# Patient Record
Sex: Female | Born: 1935 | Race: White | Hispanic: No | Smoking: Former smoker
Health system: Southern US, Community
[De-identification: ages and names within clinical notes are randomized; demographics above are authoritative.]

## PROBLEM LIST (undated history)

## (undated) DIAGNOSIS — R413 Other amnesia: Secondary | ICD-10-CM

## (undated) DIAGNOSIS — I10 Essential (primary) hypertension: Secondary | ICD-10-CM

## (undated) DIAGNOSIS — K219 Gastro-esophageal reflux disease without esophagitis: Secondary | ICD-10-CM

## (undated) DIAGNOSIS — R251 Tremor, unspecified: Secondary | ICD-10-CM

## (undated) DIAGNOSIS — F32A Depression, unspecified: Secondary | ICD-10-CM

## (undated) DIAGNOSIS — M542 Cervicalgia: Secondary | ICD-10-CM

## (undated) DIAGNOSIS — F329 Major depressive disorder, single episode, unspecified: Secondary | ICD-10-CM

## (undated) DIAGNOSIS — H3530 Unspecified macular degeneration: Secondary | ICD-10-CM

## (undated) DIAGNOSIS — Z8673 Personal history of transient ischemic attack (TIA), and cerebral infarction without residual deficits: Secondary | ICD-10-CM

## (undated) DIAGNOSIS — F3342 Major depressive disorder, recurrent, in full remission: Secondary | ICD-10-CM

## (undated) DIAGNOSIS — G4730 Sleep apnea, unspecified: Secondary | ICD-10-CM

## (undated) HISTORY — DX: Personal history of transient ischemic attack (TIA), and cerebral infarction without residual deficits: Z86.73

## (undated) HISTORY — PX: CATARACT EXTRACTION, BILATERAL: SHX1313

## (undated) HISTORY — PX: CHOLECYSTECTOMY: SHX55

## (undated) HISTORY — DX: Unspecified macular degeneration: H35.30

## (undated) HISTORY — DX: Cervicalgia: M54.2

## (undated) HISTORY — DX: Tremor, unspecified: R25.1

## (undated) HISTORY — DX: Major depressive disorder, recurrent, in full remission: F33.42

## (undated) HISTORY — DX: Other amnesia: R41.3

## (undated) HISTORY — DX: Major depressive disorder, single episode, unspecified: F32.9

## (undated) HISTORY — DX: Depression, unspecified: F32.A

## (undated) HISTORY — DX: Sleep apnea, unspecified: G47.30

## (undated) HISTORY — DX: Gastro-esophageal reflux disease without esophagitis: K21.9

## (undated) HISTORY — PX: SALIVARY GLAND SURGERY: SHX768

## (undated) HISTORY — DX: Essential (primary) hypertension: I10

---

## 1973-05-02 HISTORY — PX: ABDOMINAL HYSTERECTOMY: SUR658

## 2014-05-02 HISTORY — PX: BUNIONECTOMY: SHX129

## 2016-12-08 DIAGNOSIS — R413 Other amnesia: Secondary | ICD-10-CM | POA: Insufficient documentation

## 2016-12-08 DIAGNOSIS — Z8673 Personal history of transient ischemic attack (TIA), and cerebral infarction without residual deficits: Secondary | ICD-10-CM

## 2016-12-08 DIAGNOSIS — M542 Cervicalgia: Secondary | ICD-10-CM

## 2016-12-08 HISTORY — DX: Personal history of transient ischemic attack (TIA), and cerebral infarction without residual deficits: Z86.73

## 2016-12-08 HISTORY — DX: Other amnesia: R41.3

## 2016-12-08 HISTORY — DX: Cervicalgia: M54.2

## 2017-01-26 DIAGNOSIS — I10 Essential (primary) hypertension: Secondary | ICD-10-CM

## 2017-01-26 DIAGNOSIS — F3342 Major depressive disorder, recurrent, in full remission: Secondary | ICD-10-CM

## 2017-01-26 HISTORY — DX: Major depressive disorder, recurrent, in full remission: F33.42

## 2017-01-26 HISTORY — DX: Essential (primary) hypertension: I10

## 2017-02-06 DIAGNOSIS — R251 Tremor, unspecified: Secondary | ICD-10-CM

## 2017-02-06 HISTORY — DX: Tremor, unspecified: R25.1

## 2017-03-17 ENCOUNTER — Other Ambulatory Visit: Payer: Self-pay | Admitting: Physician Assistant

## 2017-03-17 DIAGNOSIS — R945 Abnormal results of liver function studies: Principal | ICD-10-CM

## 2017-03-17 DIAGNOSIS — R7989 Other specified abnormal findings of blood chemistry: Secondary | ICD-10-CM

## 2017-03-24 ENCOUNTER — Ambulatory Visit
Admission: RE | Admit: 2017-03-24 | Discharge: 2017-03-24 | Disposition: A | Discharge status: Home / Self Care | Payer: Medicare Other | Source: Ambulatory Visit | Attending: Physician Assistant | Admitting: Physician Assistant

## 2017-03-24 DIAGNOSIS — K7689 Other specified diseases of liver: Secondary | ICD-10-CM | POA: Insufficient documentation

## 2017-03-24 DIAGNOSIS — R7989 Other specified abnormal findings of blood chemistry: Secondary | ICD-10-CM

## 2017-03-24 DIAGNOSIS — R945 Abnormal results of liver function studies: Secondary | ICD-10-CM | POA: Diagnosis present

## 2017-03-28 ENCOUNTER — Other Ambulatory Visit: Payer: Self-pay | Admitting: Physician Assistant

## 2017-03-28 DIAGNOSIS — R7989 Other specified abnormal findings of blood chemistry: Secondary | ICD-10-CM

## 2017-03-28 DIAGNOSIS — R945 Abnormal results of liver function studies: Secondary | ICD-10-CM

## 2017-04-04 ENCOUNTER — Ambulatory Visit
Admission: RE | Admit: 2017-04-04 | Discharge: 2017-04-04 | Disposition: A | Discharge status: Home / Self Care | Payer: Medicare Other | Source: Ambulatory Visit | Attending: Physician Assistant | Admitting: Physician Assistant

## 2017-04-04 DIAGNOSIS — R945 Abnormal results of liver function studies: Secondary | ICD-10-CM | POA: Insufficient documentation

## 2017-04-04 DIAGNOSIS — R7989 Other specified abnormal findings of blood chemistry: Secondary | ICD-10-CM

## 2017-04-04 DIAGNOSIS — N2889 Other specified disorders of kidney and ureter: Secondary | ICD-10-CM | POA: Insufficient documentation

## 2017-04-04 DIAGNOSIS — D1803 Hemangioma of intra-abdominal structures: Secondary | ICD-10-CM | POA: Insufficient documentation

## 2017-04-04 MED ORDER — GADOBENATE DIMEGLUMINE 529 MG/ML IV SOLN
20.0000 mL | Freq: Once | INTRAVENOUS | Status: AC | PRN
  Administered 2017-04-04: 19 mL via INTRAVENOUS

## 2017-04-11 ENCOUNTER — Encounter: Payer: Self-pay | Admitting: Urology

## 2017-04-11 ENCOUNTER — Ambulatory Visit (INDEPENDENT_AMBULATORY_CARE_PROVIDER_SITE_OTHER): Payer: Medicare Other | Admitting: Urology

## 2017-04-11 VITALS — BP 144/74 | HR 73 | Ht 62.0 in | Wt 199.0 lb

## 2017-04-11 DIAGNOSIS — N2889 Other specified disorders of kidney and ureter: Secondary | ICD-10-CM

## 2017-04-11 DIAGNOSIS — N3941 Urge incontinence: Secondary | ICD-10-CM | POA: Diagnosis not present

## 2017-04-11 NOTE — Progress Notes (Signed)
04/11/2017 9:14 AM   Pamela Wolfe 30-Jul-1933 [id number]  Referring provider: Marinda Elk, MD [facility] [place] [address]  Chief Complaint  Patient presents with  . Renal Lesion    New patient    HPI: 83 year old female referred for further evaluation of an incidental left upper pole renal mass.  She was recently seen and evaluated for elevated LFTs.  She underwent an MRI of her liver which demonstrated a 2.2 cm benign hemangioma.  On the same scan, a 1.7 cm left upper pole renal lesion was identified with probable contrast enhancement but not characterized.  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   Most recent creatinine 0.8 on 03/16/2017.  No history of gross or microscopic hematuria.  Her family today is concerned about her urinary frequency and incontinence.    She does have a personal history of stroke and developed worsening of her urinary symptoms thereafter.  Initially, she was in urinary retention, once the catheter was removed she had difficulty getting to the bathroom in time with episodes of gross incontinence.    She wears 2-3 pads per day.  Her family reports that she often will not get up for several hours, but when she does she has difficulty getting to the bathroom on time.  They encourage her to use the bathroom more frequently throughout the day.  She only gets up once at night to void.  Overall, she is not particularly bothered by her urinary symptoms other than her incontinence keeps her from being out and about with her family.  She is somewhat of a difficult historian and much of her history today is provided by her daughter and granddaughter who are her caretakers.  She has been evaluated for this in the past and offered what sounds like an InterStim.  She denies ever trying previous medications for overactive bladder.     PMH: Past Medical History:  Diagnosis Date  .  Depression   . Essential hypertension 01/26/2017  . GERD (gastroesophageal reflux disease)   . History of stroke 12/08/2016  . Loss of memory 12/08/2016  . Neck pain 12/08/2016  . Recurrent major depressive disorder, in full remission ([place]) 01/26/2017  . Sleep apnea   . Tremor 02/06/2017    Surgical History: Past Surgical History:  Procedure Laterality Date  . ABDOMINAL HYSTERECTOMY  1975  . BUNIONECTOMY  2016  . CHOLECYSTECTOMY    . SALIVARY GLAND SURGERY      Home Medications:  Allergies as of 04/11/2017   Not on File     Medication List        Accurate as of 04/11/17 11:59 PM. Always use your most recent med list.          amLODipine 5 MG tablet Commonly known as:  NORVASC Take by mouth.   aspirin EC 81 MG tablet Take by mouth.   buPROPion 150 MG 12 hr tablet Commonly known as:  WELLBUTRIN SR   citalopram 20 MG tablet Commonly known as:  CELEXA Take by mouth.   clopidogrel 75 MG tablet Commonly known as:  PLAVIX   losartan-hydrochlorothiazide 100-25 MG tablet Commonly known as:  HYZAAR Take by mouth.   pantoprazole 20 MG tablet Commonly known as:  PROTONIX Take by mouth.       Allergies: Not on File  Family History: Family History  Problem Relation Age of Onset  . Bladder Cancer Neg Hx   .  Kidney cancer Neg Hx   . Prostate cancer Neg Hx     Social History:  reports that she has quit smoking. she has never used smokeless tobacco. She reports that she drinks alcohol. She reports that she does not use drugs.  ROS: 12 point review of systems performed today, negative other than as per HPI.  Of note, patient has word finding difficulties has limited ability to contribute significantly to her history today.    Physical Exam: BP (!) 144/74   Pulse 73   Ht 5\' 2"  (1.575 m)   Wt 199 lb (90.3 kg)   BMI 36.40 kg/m   Constitutional:  Alert and oriented, No acute distress.  Accompanied by daughter and granddaughter today. HEENT: [place] AT, moist mucus  membranes.  Trachea midline, no masses. Cardiovascular: No clubbing, cyanosis, or edema. Respiratory: Normal respiratory effort, no increased work of breathing. GI: Abdomen is soft, nontender, nondistended, no abdominal masses. Obese.   GU: No CVA tenderness.  Skin: No rashes, bruises or suspicious lesions. Neurologic: Grossly intact, no focal deficits, moving all 4 extremities.  Right-sided hand tremor noted.  Word finding difficulties appreciated today, reports that she is accompanied by her niece and cousin rather than daughter and granddaughter. Psychiatric: Normal mood and affect.  Laboratory Data: Labs from 11/18 reviewed Cr 0.8  Urinalysis Urinalysis from 03/16/2017 reviewed, no evidence of infection or microscopic blood  Pertinent Imaging: CLINICAL DATA:  Elevated liver function tests. Hepatic mass on recent ultrasound.  EXAM: MRI ABDOMEN WITHOUT AND WITH CONTRAST  TECHNIQUE: Multiplanar multisequence MR imaging of the abdomen was performed both before and after the administration of intravenous contrast.  CONTRAST:  39mL MULTIHANCE GADOBENATE DIMEGLUMINE 529 MG/ML IV SOLN  COMPARISON:  Ultrasound on 03/24/2017  FINDINGS: Lower chest: No acute findings.  Hepatobiliary: A 2.2 cm benign hemangioma is seen in segment 7 of the right lobe. A few tiny sub-cm T2 hyperintense lesions are also seen in segment 7 which are too small to characterize but most likely represent tiny cysts or hemangiomas. No suspicious liver masses are identified. No evidence of steatosis.  Prior cholecystectomy.  No evidence of biliary ductal dilatation.  Pancreas:  No mass or inflammatory changes.  Spleen:  Within normal limits in size and appearance.  Adrenals/Urinary Tract: A subcapsular lesion is seen in the upper pole of the left kidney which shows T2 hypointensity and probable contrast enhancement. This measures 1.7 cm, and is suspicious for solid renal neoplasm. No other  renal masses identified. No evidence of hydronephrosis.  Stomach/Bowel: Visualized portions within abdomen are unremarkable.  Vascular/Lymphatic: No pathologically enlarged lymph nodes identified. No abdominal aortic aneurysm.  Other:  None.  Musculoskeletal:  No suspicious bone lesions identified.  IMPRESSION: 2.2 cm benign hemangioma in the right hepatic lobe, corresponding with lesion seen on previous ultrasound.  1.7 cm T2 hypointense subcapsular lesion in upper pole of left kidney with probable contrast enhancement. 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 Recommend urology consultation and consideration of biopsy. This recommendation follows ACR consensus guidelines: Management of the Incidental Renal Mass on CT: A White Paper of the ACR Incidental Findings Committee. J Am Coll Radiol [phone].   Electronically Signed   By: Earle Gell M.D.   On: 04/04/2017 11:21  MRI reviewed personally today with the patient and her family.  Postvoid residual today minimal on bladde  Assessment & Plan:    1. Left renal mass A solid renal mass raises the suspicion of primary renal malignancy.  We discussed this in detail and in regards to the spectrum of renal masses which includes cysts (pure cysts are considered benign), solid masses and everything in between. The risk of metastasis increases as the size of solid renal mass increases. In general, it is believed that the risk of metastasis for renal masses less than 3-4 cm is small (up to approximately 5%) based mainly on large retrospective studies. In some cases and especially in patients of older age and multiple comorbidities a surveillance approach may be appropriate. The treatment of solid renal masses includes: surveillance, cryoablation (percutaneous and laparoscopic) in addition to partial and complete nephrectomy (each with option of laparoscopic, robotic and open depending on appropriateness).  Furthermore, nephrectomy appears to be an independent risk factor for the development of chronic kidney disease suggesting that nephron sparing approaches should be implored whenever feasible. We reviewed these options in context of the patients current situation as well as the pros and cons of each. For cystic renal masses, we reviewed the Bosniak classification and discussed that Bosniak 3 lesions harbor a 50% chance of malignancy whereas Bosniak 4 cysts have a solid and 90-95% are malignant in nature.   Given the small size of the lesion, her age and medical comorbidities, I would recommend surveillance.    Plan for repeat CT abdomen with and without contrast in 6 months.  Patient and family are agreeable to this plan.  - CT Abdomen W Contrast; Future  2. Urge incontinence Lengthy discussion today behavioral modification including timed and double voiding during the daytime. She also enjoys sodas and coffee which are bladder irritants, would avoid taking these beverages as much as possible She is also given samples of Myrbetriq 25 mg x 4 weeks today She is not a good candidate for anticholinergic medication given her issues with memory loss and age. Family has agreed to implement behavior changes first, and then use as needed.  The let us know if they would like to fill this prescription.   Return in about 6 months (around 10/10/2017) for CT scan.  [name], MD  [facility] [address], [place], [place] [address] [phone]

## 2017-04-13 ENCOUNTER — Encounter: Payer: Self-pay | Admitting: Urology

## 2017-05-03 ENCOUNTER — Ambulatory Visit: Payer: Self-pay | Admitting: Urology

## 2017-10-05 ENCOUNTER — Ambulatory Visit
Admission: RE | Admit: 2017-10-05 | Discharge: 2017-10-05 | Disposition: A | Discharge status: Home / Self Care | Payer: Medicare Other | Source: Ambulatory Visit | Attending: Urology | Admitting: Urology

## 2017-10-05 DIAGNOSIS — N2889 Other specified disorders of kidney and ureter: Secondary | ICD-10-CM

## 2017-10-05 LAB — POCT I-STAT CREATININE: CREATININE: 0.8 mg/dL (ref 0.44–1.00)

## 2017-10-05 MED ORDER — IOPAMIDOL (ISOVUE-300) INJECTION 61%
100.0000 mL | Freq: Once | INTRAVENOUS | Status: AC | PRN
  Administered 2017-10-05: 100 mL via INTRAVENOUS

## 2017-10-10 ENCOUNTER — Ambulatory Visit (INDEPENDENT_AMBULATORY_CARE_PROVIDER_SITE_OTHER): Payer: Medicare Other | Admitting: Urology

## 2017-10-10 ENCOUNTER — Encounter: Payer: Self-pay | Admitting: Urology

## 2017-10-10 VITALS — BP 138/62 | HR 74 | Ht 63.0 in | Wt 203.0 lb

## 2017-10-10 DIAGNOSIS — N2889 Other specified disorders of kidney and ureter: Secondary | ICD-10-CM | POA: Diagnosis not present

## 2017-10-10 DIAGNOSIS — R351 Nocturia: Secondary | ICD-10-CM | POA: Diagnosis not present

## 2017-10-10 NOTE — Progress Notes (Signed)
10/10/2017 2:57 PM   Pamela Wolfe Dec 02, 1933 [id number]  Referring provider: Marinda Elk, [place] [facility] [place] [address]  Chief Complaint  Patient presents with  . Results    56month    HPI: 84 year old female who returns today for six-month follow-up of an incidental left upper pole renal mass.  Renal mass was incidentally discovered on further evaluation for elevated LFTs.  She underwent an MRI of her liver which demonstrated a 2.2 cm benign hemangioma.  On the same scan, a 1.7 cm left upper pole renal lesion was identified with probable contrast enhancement but not characterized.  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   She follows up today with repeat CT scan abdomen with and without contrast which shows essentially stable size lesion measuring 17 x 16 mm which is exophytic and enhancing.  She also has a personal history of urinary frequency and urge incontinence.  After last visit, she was given Myrbetriq 25 mg for 1 month to try.  She did not feel like she benefited much from this medication.  Her daughter notes that during the daytime, she does pretty well.  Is particularly at nighttime when she has episodes of urinary frequency and incontinence.  She does have a personal history of OSA and has not been using her CPAP.  She also has some mild LE edema.  PMH: Past Medical History:  Diagnosis Date  . Depression   . Essential hypertension 01/26/2017  . GERD (gastroesophageal reflux disease)   . History of stroke 12/08/2016  . Loss of memory 12/08/2016  . Neck pain 12/08/2016  . Recurrent major depressive disorder, in full remission ([place]) 01/26/2017  . Sleep apnea   . Tremor 02/06/2017    Surgical History: Past Surgical History:  Procedure Laterality Date  . ABDOMINAL HYSTERECTOMY  1975  . BUNIONECTOMY  2016  . CHOLECYSTECTOMY    . SALIVARY GLAND SURGERY      Home Medications:    Allergies as of 10/10/2017   No Known Allergies     Medication List        Accurate as of 10/10/17  2:57 PM. Always use your most recent med list.          amLODipine 5 MG tablet Commonly known as:  NORVASC Take by mouth.   aspirin EC 81 MG tablet Take by mouth.   buPROPion 150 MG 12 hr tablet Commonly known as:  WELLBUTRIN SR   citalopram 20 MG tablet Commonly known as:  CELEXA Take by mouth.   clopidogrel 75 MG tablet Commonly known as:  PLAVIX   losartan-hydrochlorothiazide 100-25 MG tablet Commonly known as:  HYZAAR Take by mouth.   pantoprazole 20 MG tablet Commonly known as:  PROTONIX Take by mouth.       Allergies: No Known Allergies  Family History: Family History  Problem Relation Age of Onset  . Bladder Cancer Neg Hx   . Kidney cancer Neg Hx   . Prostate cancer Neg Hx     Social History:  reports that she has quit smoking. She has never used smokeless tobacco. She reports that she drinks alcohol. She reports that she does not use drugs.  ROS: UROLOGY Frequent Urination?: Yes Hard to postpone urination?: No Burning/pain with urination?: No Get up at night to urinate?: Yes Leakage of urine?: Yes Urine stream starts and stops?: No Trouble starting stream?: No Do you have to strain to urinate?:  No Blood in urine?: No Urinary tract infection?: No Sexually transmitted disease?: No Injury to kidneys or bladder?: No Painful intercourse?: No Weak stream?: No Currently pregnant?: No Vaginal bleeding?: No Last menstrual period?: n  Gastrointestinal Nausea?: No Vomiting?: No Indigestion/heartburn?: Yes Diarrhea?: No Constipation?: No  Constitutional Fever: No Night sweats?: No Weight loss?: No Fatigue?: No  Skin Skin rash/lesions?: No Itching?: No  Eyes Blurred vision?: No Double vision?: No  Ears/Nose/Throat Sore throat?: No Sinus problems?: No  Hematologic/Lymphatic Swollen glands?: No Easy bruising?:  No  Cardiovascular Leg swelling?: No Chest pain?: No  Respiratory Cough?: Yes Shortness of breath?: No  Endocrine Excessive thirst?: No  Musculoskeletal Back pain?: No Joint pain?: No  Neurological Headaches?: No Dizziness?: No  Psychologic Depression?: Yes Anxiety?: No  Physical Exam: BP 138/62   Pulse 74   Ht 5\' 3"  (1.6 m)   Wt 203 lb (92.1 kg)   BMI 35.96 kg/m   Constitutional:  Alert and oriented, No acute distress.  Accompanied by daughter today.   HEENT: [place] AT, moist mucus membranes.  Trachea midline, no masses. Cardiovascular: No clubbing, cyanosis, or edema. Respiratory: Normal respiratory effort, no increased work of breathing. Skin: No rashes, bruises or suspicious lesions. Neurologic: Grossly intact, no focal deficits, moving all 4 extremities. Psychiatric: Normal mood and affect.  Laboratory Data: Lab Results  Component Value Date   CREATININE 0.80 10/05/2017    Urinalysis N/a  Pertinent Imaging: CLINICAL DATA:  Renal mass.  EXAM: CT ABDOMEN WITHOUT AND WITH CONTRAST  TECHNIQUE: Multidetector CT imaging of the abdomen was performed following the standard protocol before and following the bolus administration of intravenous contrast.  CONTRAST:  180mL ISOVUE-300 IOPAMIDOL (ISOVUE-300) INJECTION 61%  COMPARISON:  MRI 04/04/2017  FINDINGS: Lower chest:  Lung bases are clear.  Hepatobiliary: Enhancing lesion in the superior aspect of the RIGHT hepatic lobe consistent hemangioma. No biliary duct dilatation. Postcholecystectomy.  Pancreas: Normal pancreatic parenchymal intensity. No ductal dilatation or inflammation.  Spleen: Normal spleen.  Adrenals/urinary tract: Adrenal glands are normal.  Extending from the pole of the LEFT kidney is small round lesion measuring 17 mm by 16 mm (image 62/3). This lesion demonstrates mild post-contrast enhancement and delayed washout (series 16). Lesion is exophytic from the upper pole  in position between the upper pole in the spleen (coronal image 108/11). Lesion contained within the pararenal space.  No additional enhancing renal cortical lesions are present. Several hypodense lesions in LEFT and RIGHT kidney which are too small to characterize likely represent benign cysts.  Stomach/Bowel: Stomach and limited of the small bowel is unremarkable  Vascular/Lymphatic: Abdominal aortic normal caliber. No retroperitoneal periportal lymphadenopathy.  Musculoskeletal: No aggressive osseous lesion  IMPRESSION: 1. Small round enhancing exophytic mass extends from the pole of the LEFT kidney remains concerning for renal cell carcinoma. 2. On small bilateral hypodense lesions in the kidneys too small characterize.   Electronically Signed   By: Suzy Bouchard M.D.   On: 10/05/2017 13:55  CT scan was personally reviewed today.  This was compared to previous imaging from 04/2017 and appears to be unchanged in overall size.  Assessment & Plan:    1. 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 Nocturia We discussed the pathophysiology of primary nighttime symptoms We discussed behavioral modification, compliance with CPAP use and leg elevation during the daytime Not interested in any further pharmacotherapy   Return in about 1 year (around 10/11/2018) for CT scan.  [name], MD  [facility] [address], [place], [place] [address] [phone]

## 2018-10-16 ENCOUNTER — Ambulatory Visit: Payer: Medicare Other | Admitting: Urology

## 2018-10-17 ENCOUNTER — Other Ambulatory Visit: Payer: Self-pay | Admitting: Radiology

## 2018-10-17 DIAGNOSIS — R351 Nocturia: Secondary | ICD-10-CM

## 2018-10-17 DIAGNOSIS — N2889 Other specified disorders of kidney and ureter: Secondary | ICD-10-CM

## 2018-10-18 ENCOUNTER — Telehealth: Payer: Medicare Other | Admitting: Urology

## 2018-10-24 ENCOUNTER — Ambulatory Visit: Admission: RE | Admit: 2018-10-24 | Payer: Medicare Other | Source: Ambulatory Visit

## 2018-10-25 ENCOUNTER — Telehealth: Payer: Medicare Other | Admitting: Urology

## 2018-10-31 ENCOUNTER — Ambulatory Visit
Admission: RE | Admit: 2018-10-31 | Discharge: 2018-10-31 | Disposition: A | Discharge status: Home / Self Care | Payer: Medicare Other | Source: Ambulatory Visit | Attending: Urology | Admitting: Urology

## 2018-10-31 ENCOUNTER — Other Ambulatory Visit: Payer: Self-pay

## 2018-10-31 DIAGNOSIS — R351 Nocturia: Secondary | ICD-10-CM | POA: Diagnosis present

## 2018-10-31 DIAGNOSIS — N2889 Other specified disorders of kidney and ureter: Secondary | ICD-10-CM | POA: Insufficient documentation

## 2018-10-31 LAB — POCT I-STAT CREATININE: Creatinine, Ser: 0.9 mg/dL (ref 0.44–1.00)

## 2018-10-31 MED ORDER — IOHEXOL 300 MG/ML  SOLN
100.0000 mL | Freq: Once | INTRAMUSCULAR | Status: AC | PRN
  Administered 2018-10-31: 100 mL via INTRAVENOUS

## 2018-11-09 ENCOUNTER — Other Ambulatory Visit: Payer: Self-pay

## 2018-11-09 ENCOUNTER — Telehealth (INDEPENDENT_AMBULATORY_CARE_PROVIDER_SITE_OTHER): Payer: Medicare Other | Admitting: Urology

## 2018-11-09 DIAGNOSIS — N2889 Other specified disorders of kidney and ureter: Secondary | ICD-10-CM | POA: Diagnosis not present

## 2018-11-09 NOTE — Progress Notes (Signed)
Virtual Visit via Video Note  I connected with Pamela Wolfe on 11/10/18 at  9:30 AM EDT by a video enabled telemedicine application and verified that I am speaking with the correct person using two identifiers.  Location: Patient: home, accompanied by daughter Provider: office   I discussed the limitations of evaluation and management by telemedicine and the availability of in person appointments. The patient expressed understanding and agreed to proceed.  History of Present Illness: 85 year old female who returns today via virtual visit for surveillance of an incidental renal mass.  Renal mass was incidentally discovered on further evaluation for elevated LFTs. She underwent an MRI of her liver which demonstrated a 2.2 cm benign hemangioma. On the same scan, a 1.7 cm left upper pole renal lesion was identified with probable contrast enhancement but not characterized. 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   CT scan abdomen 09/2017 with and without contrast which shows essentially stable size lesion measuring 17 x 16 mm which is exophytic and enhancing.   She follows up today with serial annual imaging.  This is in the form of CT abdomen with and without contrast on 10/31/2018.  The mass is unchanged in size over the past 12 months.  This CT scan imaging was personally reviewed today and compared to previous.  Overall, she is doing well.  No new medical issues over the past year.  No flank pain or gross hematuria.   Observations/Objective: Accompanied by daughter today.  Appears well.  Assessment and Plan:  1. Left renal mass Stable exophytic left renal mass, unchanged.  Given her age and lack of interval growth, would recommend continued surveillance but no intervention.  She is agreeable this plan.  We will plan for renal ultrasound in 1 year to de-escalate imaging.  She is agreeable this plan.  If her not able to see it on renal  ultrasound, we could get a CT at that point in time.   Follow Up Instructions: F/u 1 year with RUS   I discussed the assessment and treatment plan with the patient. The patient was provided an opportunity to ask questions and all were answered. The patient agreed with the plan and demonstrated an understanding of the instructions.   The patient was advised to call back or seek an in-person evaluation if the symptoms worsen or if the condition fails to improve as anticipated.  I provided 7 minutes of non-face-to-face time during this encounter.   [name], MD

## 2019-05-14 ENCOUNTER — Ambulatory Visit: Payer: Medicare Other | Attending: Internal Medicine

## 2019-05-14 DIAGNOSIS — Z23 Encounter for immunization: Secondary | ICD-10-CM | POA: Insufficient documentation

## 2019-05-14 NOTE — Progress Notes (Signed)
   Covid-19 Vaccination Clinic  Name:  Parnika Segoviano    MRN: [id number] DOB: 1933/07/26  05/14/2019  Ms. Lawton was observed post Covid-19 immunization for 30 minutes based on pre-vaccination screening without incidence. She was provided with Vaccine Information Sheet and instruction to access the V-Safe system.   Ms. Gloria was instructed to call 911 with any severe reactions post vaccine: Marland Kitchen Difficulty breathing  . Swelling of your face and throat  . A fast heartbeat  . A bad rash all over your body  . Dizziness and weakness    Immunizations Administered    Name Date Dose VIS Date Route   Pfizer COVID-19 Vaccine 05/14/2019 11:51 AM 0.3 mL 04/12/2019 Intramuscular   Manufacturer: [place]   Lot: [id number]   [place]: [id number]

## 2019-06-03 ENCOUNTER — Ambulatory Visit: Payer: Medicare Other | Attending: Internal Medicine

## 2019-06-03 DIAGNOSIS — Z23 Encounter for immunization: Secondary | ICD-10-CM | POA: Insufficient documentation

## 2019-06-03 NOTE — Progress Notes (Signed)
   Covid-19 Vaccination Clinic  Name:  Pamela Wolfe    MRN: [id number] DOB: 11/20/1933  06/03/2019  [name] was observed post Covid-19 immunization for 15 minutes without incidence. She was provided with Vaccine Information Sheet and instruction to access the V-Safe system.   [name] was instructed to call 911 with any severe reactions post vaccine: Marland Kitchen Difficulty breathing  . Swelling of your face and throat  . A fast heartbeat  . A bad rash all over your body  . Dizziness and weakness    Immunizations Administered    Name Date Dose VIS Date Route   Pfizer COVID-19 Vaccine 06/03/2019 11:23 AM 0.3 mL 04/12/2019 Intramuscular   Manufacturer: [place]   Lot: [id number]   [place]: [id number]

## 2019-09-16 ENCOUNTER — Telehealth: Payer: Self-pay

## 2019-09-16 NOTE — Telephone Encounter (Signed)
Incoming call from pt's home health nurse ([facility]) Lilia Pro, who is calling in behalf of the pt's family. She states that the pt has had 2-3 urinary infections in the past 2 months. These have been treated by the pt's PCP. The family now questions of pt needs to be on prophylactic antibiotics? Would you like a virtual visit with family to discuss vs. In person visit? Please advise.

## 2019-09-17 NOTE — Telephone Encounter (Signed)
This is a new problem for which she is never been seen or evaluated in our office.  I followed her for a renal mass. Recommend in person visit.  It can be with Larene Beach or Howie Ill, MD

## 2019-09-17 NOTE — Telephone Encounter (Signed)
Called pt's granddaughter per DPR, no answer LM for granddaughter to call back and schedule appt for pt.

## 2019-09-26 ENCOUNTER — Ambulatory Visit: Payer: Medicare Other | Admitting: Physician Assistant

## 2019-09-27 ENCOUNTER — Other Ambulatory Visit: Payer: Self-pay

## 2019-09-27 ENCOUNTER — Encounter: Payer: Self-pay | Admitting: Physician Assistant

## 2019-09-27 ENCOUNTER — Ambulatory Visit (INDEPENDENT_AMBULATORY_CARE_PROVIDER_SITE_OTHER): Payer: Medicare Other | Admitting: Physician Assistant

## 2019-09-27 VITALS — BP 163/67 | HR 77 | Ht 63.0 in | Wt 208.0 lb

## 2019-09-27 DIAGNOSIS — N390 Urinary tract infection, site not specified: Secondary | ICD-10-CM | POA: Diagnosis not present

## 2019-09-27 LAB — URINALYSIS, COMPLETE
Bilirubin, UA: NEGATIVE
Leukocytes,UA: NEGATIVE
Nitrite, UA: NEGATIVE
Protein,UA: NEGATIVE
RBC, UA: NEGATIVE
Specific Gravity, UA: >1.03 — ABNORMAL HIGH (ref 1.005–1.030)
Urobilinogen, Ur: 0.2 mg/dL (ref 0.2–1.0)
pH, UA: 5 (ref 5.0–7.5)

## 2019-09-27 LAB — MICROSCOPIC EXAMINATION: Bacteria, UA: NONE SEEN

## 2019-09-27 LAB — BLADDER SCAN AMB NON-IMAGING

## 2019-09-27 NOTE — Patient Instructions (Signed)
Recurrent UTI Prevention Strategies  1. Stay well hydrated. 2. Continue taking an over-the-counter cranberry supplement for urinary tract health. Take this once or twice daily on an empty stomach, e.g. right before bed. 3. Continue taking an over-the-counter probiotic, preferably containing lactobacillus. Take this daily. 4. Start vaginal estrogen cream. Apply a pea-sized amount around the urethra three times weekly.

## 2019-09-27 NOTE — Progress Notes (Signed)
09/27/2019 12:24 PM   Pamela Wolfe November 13, 1933 [id number]  CC: Chief Complaint  Patient presents with  . Recurrent UTI    HPI: Pamela Wolfe is a 86 y.o. female with PMH stroke, urinary incontinence, and dementia who presents today for evaluation of recurrent UTI. She is an established [facility] patient who last saw Dr. Erlene Quan on 11/09/2018 for surveillance visit of a left upper pole renal lesion.  She is accompanied today by her daughter, who contributes to HPI.  Patient's daughter reports that her mother has a long history of UTIs, frequency unknown.  She leaves that the frequency of UTIs suddenly increased after her mother's October 2016 stroke, which rendered her incontinent; she wears a diaper for management of this. Patient was previously seen by a urologist in [place] who offered an implantable device for management of her urinary incontinence around 2017.  She declined this.  Now, patient experiences an average of 3-4 UTIs annually, however she has had 3 UTIs in the past 2 months.  Per chart review, patient has had 2 positive urine cultures in the past 6 months, both with pansensitive E. coli.  Patient's daughter reports that her mother has poor insight into her UTIs.  Daughter states she can identify when her mother has a UTI based on her mother wincing in pain with urination.  She denies gross hematuria, patient denies flank pain.  Patient has had a remote history of constipation, resolved over the past 2 years.  She does have urinary frequency at baseline.  Additionally, she is known to have poor compliance with medications, fluid intake, and hygiene.  Patient has been taking cranberry supplements once daily after breakfast for the past 2 years.  Additionally, she started a daily probiotic for UTIs containing lactobacillus 1 week ago.  She does not have a history of nephrolithiasis or breast cancer.  CT abdomen with and without contrast dated 10/31/2019 without nephrolithiasis.  Patient's  daughter is not concerned for UTI today.  In-office UA today positive for trace glucose and trace ketones; urine microscopy pan negative. PVR 29mL.  PMH: Past Medical History:  Diagnosis Date  . Depression   . Essential hypertension 01/26/2017  . GERD (gastroesophageal reflux disease)   . History of stroke 12/08/2016  . Loss of memory 12/08/2016  . Neck pain 12/08/2016  . Recurrent major depressive disorder, in full remission ([place]) 01/26/2017  . Sleep apnea   . Tremor 02/06/2017    Surgical History: Past Surgical History:  Procedure Laterality Date  . ABDOMINAL HYSTERECTOMY  1975  . BUNIONECTOMY  2016  . CHOLECYSTECTOMY    . SALIVARY GLAND SURGERY      Home Medications:  Allergies as of 09/27/2019   No Known Allergies     Medication List       Accurate as of Sep 27, 2019 12:24 PM. If you have any questions, ask your nurse or doctor.        amLODipine 5 MG tablet Commonly known as: NORVASC Take by mouth.   aspirin EC 81 MG tablet Take by mouth.   buPROPion 150 MG 12 hr tablet Commonly known as: WELLBUTRIN SR   citalopram 20 MG tablet Commonly known as: CELEXA Take by mouth.   clopidogrel 75 MG tablet Commonly known as: PLAVIX   losartan-hydrochlorothiazide 100-25 MG tablet Commonly known as: HYZAAR Take by mouth.   meloxicam 7.5 MG tablet Commonly known as: MOBIC Take 7.5 mg by mouth daily.   memantine 10 MG tablet Commonly known as: NAMENDA Take  by mouth.   pantoprazole 20 MG tablet Commonly known as: PROTONIX Take by mouth.       Allergies:  No Known Allergies  Family History: Family History  Problem Relation Age of Onset  . Bladder Cancer Neg Hx   . Kidney cancer Neg Hx   . Prostate cancer Neg Hx     Social History:   reports that she has quit smoking. She has never used smokeless tobacco. She reports current alcohol use. She reports that she does not use drugs.  Physical Exam: BP (!) 163/67   Pulse 77   Ht 5\' 3"  (1.6 m)   Wt 208 lb  (94.3 kg)   BMI 36.85 kg/m   Constitutional:  Alert, no acute distress, nontoxic appearing HEENT: [place], AT Cardiovascular: No clubbing, cyanosis, or edema Respiratory: Normal respiratory effort, no increased work of breathing Skin: No rashes, bruises or suspicious lesions Neurologic: Grossly intact, no focal deficits, moving all 4 extremities Psychiatric: Not responding appropriately to questions  Laboratory Data: Results for orders placed or performed in visit on 09/27/19  Microscopic Examination   URINE  Result Value Ref Range   WBC, UA 0-5 0 - 5 /hpf   RBC 0-2 0 - 2 /hpf   Epithelial Cells (non renal) 0-10 0 - 10 /hpf   Casts Present (A) None seen /lpf   Cast Type Hyaline casts N/A   Mucus, UA Present (A) Not Estab.   Bacteria, UA None seen None seen/Few  Urinalysis, Complete  Result Value Ref Range   Specific Gravity, UA >1.030 (H) 1.005 - 1.030   pH, UA 5.0 5.0 - 7.5   Color, UA Yellow Yellow   Appearance Ur Cloudy (A) Clear   Leukocytes,UA Negative Negative   Protein,UA Negative Negative/Trace   Glucose, UA Trace (A) Negative   Ketones, UA Trace (A) Negative   RBC, UA Negative Negative   Bilirubin, UA Negative Negative   Urobilinogen, Ur 0.2 0.2 - 1.0 mg/dL   Nitrite, UA Negative Negative   Microscopic Examination See below:   Bladder Scan (Post Void Residual) in office  Result Value Ref Range   Scan Result 26mL    Pertinent Imaging: CT abdomen with and without contrast, 10/31/2018: CLINICAL DATA:  Left renal mass follow up  EXAM: CT ABDOMEN WITHOUT AND WITH CONTRAST  TECHNIQUE: Multidetector CT imaging of the abdomen was performed following the standard protocol before and following the bolus administration of intravenous contrast.  CONTRAST:  116mL OMNIPAQUE IOHEXOL 300 MG/ML  SOLN  COMPARISON:  Serve multiple 10/05/2017 and MRI from 04/04/2017  FINDINGS: Lower chest: Calcified granuloma in the posterior basal segment right lower lobe. Calcified  right infrahilar lymph nodes. Coronary artery and aortic valve calcifications.  Hepatobiliary: Delayed enhancement in the patient's known 2.5 by 2.1 cm right hepatic lobe hemangioma, image 43/15. Focal accentuated enhancement measuring 0.7 cm in diameter the subcapsular portion of the right hepatic lobe on image 60/8, likewise most compatible with hemangioma based on delayed enhancement on 04/04/2017 MRI. Cholecystectomy.  Pancreas: Unremarkable  Spleen: Unremarkable  Adrenals/Urinary Tract: Adrenal glands appear normal.  1.8 by 1.6 cm left kidney upper pole exophytic lesion with precontrast density 46 Hounsfield units, postcontrast density 90 Hounsfield units on arterial phase images, and delayed phase image density averaging about 77 Hounsfield units. Appearance compatible with enhancing mass/renal cell carcinoma. Comparing to the earliest available imaging of 04/04/2017, I see no appreciable change in size.  Other tiny hypodense lesions in both kidneys are technically too small to  characterize although statistically most likely to be cysts. No appreciable upper urinary tract calculi or other filling defects along the upper urinary tract urothelium.  Stomach/Bowel: Small hiatal hernia. Descending colon diverticulosis.  Vascular/Lymphatic: Aortoiliac atherosclerotic vascular disease. No tumor thrombus in the left renal vein. No pathologic adenopathy.  Other: No supplemental non-categorized findings.  Musculoskeletal: Multilevel lumbar spondylosis and degenerative disc disease.  IMPRESSION: 1. 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 This is not appreciably changed in size compared to the earliest available comparison of 04/04/2017. No adenopathy or tumor thrombus in the left renal vein. 2. Other imaging findings of potential clinical significance: 2 right hepatic lobe hemangiomas are  present. Small hiatal hernia. Descending colon diverticulosis. Aortic Atherosclerosis (ICD10-I70.0). Lumbar spondylosis and degenerative disc disease.   Electronically Signed   By: Van Clines M.D.   On: 10/31/2018 13:14  I personally reviewed the images referenced above and note the absence of bilateral nephrolithiasis.  Assessment & Plan:   1. Recurrent UTI 86 year old female with PMH dementia, stroke, urinary incontinence, and left upper pole renal mass on active surveillance presents today for evaluation of recurrent UTI.  It does not appear that this is a new problem, however frequency may be increasing.  She has had 2 positive urine cultures in the past 6 months and meets the clinical definition for recurrent UTI.  Case complicated by patient's poor compliance with medications, fluid intake, and hygiene.  Additionally, her dementia limits her insight into her voiding symptoms so is difficult to determine if her wincing as noted by her daughter represents true dysuria versus vaginal burning in the setting of atrophic vaginitis.  Additionally, she is at risk of asymptomatic bacteriuria due to her age.  UA today reassuring for infection.  Will send for culture.  If urine culture is positive, this will increase my suspicion for asymptomatic bacteriuria as contributory to this picture.  I had a lengthy conversation with the patient and her daughter today.  I explained that I would like to start with conservative management and defer suppressive antibiotics at this time.  They are in agreement with this plan.  I counseled them to continue cranberry supplementation, ideally twice daily on an empty stomach, continue daily lactobacillus-containing probiotic, increase fluid intake, and start topical vaginal estrogen cream 3 times weekly.  I provided Premarin samples in clinic today.  We will plan for symptom recheck at her next scheduled follow-up in approximately 2 months.  If these  measures do not decrease her frequency of infection, may consider suppressive antibiotics in the future.  Counseled patient to return to clinic sooner if she develops dysuria in the interim.  Patient and daughter expressed understanding. - Urinalysis, Complete - CULTURE, URINE COMPREHENSIVE - Bladder Scan (Post Void Residual) in office  Return in about 2 months (around 11/27/2019) for rUTI, renal mass follow-up with Dr. Erlene Quan.  Debroah Loop, PA-C  [facility] [address] [place], [place] [address] [phone]

## 2019-10-01 LAB — CULTURE, URINE COMPREHENSIVE

## 2019-11-07 ENCOUNTER — Telehealth: Payer: Self-pay | Admitting: *Deleted

## 2019-11-07 NOTE — Telephone Encounter (Signed)
Left VM to have Renal US done prior to appointment.

## 2019-11-12 ENCOUNTER — Ambulatory Visit: Payer: Medicare Other | Admitting: Urology

## 2019-11-22 ENCOUNTER — Other Ambulatory Visit: Payer: Self-pay | Admitting: *Deleted

## 2019-11-22 DIAGNOSIS — N2889 Other specified disorders of kidney and ureter: Secondary | ICD-10-CM

## 2019-11-27 ENCOUNTER — Ambulatory Visit: Payer: Medicare Other | Admitting: Urology

## 2019-12-03 ENCOUNTER — Other Ambulatory Visit: Payer: Self-pay

## 2019-12-03 ENCOUNTER — Ambulatory Visit
Admission: RE | Admit: 2019-12-03 | Discharge: 2019-12-03 | Disposition: A | Discharge status: Home / Self Care | Payer: Medicare Other | Source: Ambulatory Visit | Attending: Urology | Admitting: Urology

## 2019-12-03 DIAGNOSIS — N2889 Other specified disorders of kidney and ureter: Secondary | ICD-10-CM | POA: Insufficient documentation

## 2019-12-03 NOTE — Progress Notes (Signed)
12/04/2019 1:47 PM   [name] 1933/12/27 [id number]  Referring provider: Marinda Elk, MD [place] [facility],  [place] [address] Chief Complaint  Patient presents with  . left renal mass    HPI: Pamela Wolfe is a 86 y.o. female with urinary incontinence and recurrent UTI returns for follow up and review of RUS.   Patient's daughter reports that her mother has a long history of UTIs, frequency unknown.  She leaves that the frequency of UTIs suddenly increased after her mother's October 2016 stroke, which rendered her incontinent; she wears a diaper for management of this. Patient was previously seen by a urologist in [place] who offered an implantable device for management of her urinary incontinence around 2017.  She declined this.  Now, patient experiences an average of 3-4 UTIs annually, however she has had 3 UTIs in the past 2 months.  Per chart review, patient has had 2 positive urine cultures in the past 6 months, both with pansensitive E. Coli  Patient has had a remote history of constipation, resolved over the past 2 years.  She does have urinary frequency at baseline. Additionally, she is known to have poor compliance with medications, fluid intake, and hygiene.  RUS on 12/03/2019 showed left upper pole renal lesion measures 1.9 x 2 x 2 cm. This previously measured 1.8 x 1.6 cm on 10/31/2018 CT. The apparent increased size may be due to true growth versus differences in modality. Otherwise unremarkable renal ultrasound.  The patient has been having a lot of accidents recently secondary to worsening incontinence s/p stroke. She stopped using estrogen cream. Her daughter notes having trouble keep pads in stock for use.    PMH: Past Medical History:  Diagnosis Date  . Depression   . Essential hypertension 01/26/2017  . GERD (gastroesophageal reflux disease)   . History of stroke 12/08/2016  . Loss of memory 12/08/2016  . Macular  degeneration   . Neck pain 12/08/2016  . Recurrent major depressive disorder, in full remission ([place]) 01/26/2017  . Sleep apnea   . Tremor 02/06/2017    Surgical History: Past Surgical History:  Procedure Laterality Date  . ABDOMINAL HYSTERECTOMY  1975  . BUNIONECTOMY  2016  . CATARACT EXTRACTION, BILATERAL    . CHOLECYSTECTOMY    . SALIVARY GLAND SURGERY      Home Medications:  Allergies as of 12/04/2019   No Known Allergies     Medication List       Accurate as of December 04, 2019  1:47 PM. If you have any questions, ask your nurse or doctor.        amLODipine 5 MG tablet Commonly known as: NORVASC Take by mouth.   aspirin EC 81 MG tablet Take by mouth.   Besivance 0.6 % Susp Generic drug: Besifloxacin HCl Place 1 drop into the left eye 3 (three) times daily.   buPROPion 150 MG 12 hr tablet Commonly known as: WELLBUTRIN SR   citalopram 20 MG tablet Commonly known as: CELEXA Take by mouth.   clopidogrel 75 MG tablet Commonly known as: PLAVIX   Durezol 0.05 % Emul Generic drug: Difluprednate   losartan-hydrochlorothiazide 100-25 MG tablet Commonly known as: HYZAAR Take by mouth.   meloxicam 7.5 MG tablet Commonly known as: MOBIC Take 7.5 mg by mouth daily.   memantine 10 MG tablet Commonly known as: NAMENDA Take by mouth.   pantoprazole 20 MG tablet Commonly known as: PROTONIX Take by mouth.   Prolensa  0.07 % Soln Generic drug: Bromfenac Sodium SMARTSIG:1 Drop(s) Right Eye Every Evening       Allergies: No Known Allergies  Family History: Family History  Problem Relation Age of Onset  . Bladder Cancer Neg Hx   . Kidney cancer Neg Hx   . Prostate cancer Neg Hx     Social History:  reports that she has quit smoking. She has never used smokeless tobacco. She reports current alcohol use. She reports that she does not use drugs.   Physical Exam: BP (!) 143/85   Pulse 72   Wt 208 lb (94.3 kg)   BMI 36.85 kg/m   Constitutional:  Alert  and oriented, No acute distress. Accompanied by daughter. HEENT: [place] AT, moist mucus membranes.  Trachea midline, no masses. Cardiovascular: No clubbing, cyanosis, or edema. Respiratory: Normal respiratory effort, no increased work of breathing. Skin: No rashes, bruises or suspicious lesions. Neurologic: Grossly intact, no focal deficits, moving all 4 extremities. Psychiatric: Normal mood and affect.    Pertinent Imaging:  Results for orders placed during the hospital encounter of 12/03/19  Ultrasound renal complete  Narrative CLINICAL DATA:  Known left renal mass.  EXAM: RENAL / URINARY TRACT ULTRASOUND COMPLETE  COMPARISON:  CT 10/31/2018.  MRI 04/04/2017  FINDINGS: Right Kidney:  Renal measurements: 10.3 x 5.1 x 5.4 cm = volume: 148 mL. Echogenicity within normal limits. No mass or hydronephrosis visualized.  Left Kidney:  Renal measurements: 9.7 x 4.9 x 5.5 cm = volume: 136 mL. Hypoechoic lesion in the upper left kidney measures 1.9 x 2 x 2 cm. No other focal renal lesion. No hydronephrosis. Normal renal echogenicity.  Bladder:  Appears normal for degree of bladder distention.  Other:  None.  IMPRESSION: 1. Left upper pole renal lesion measures 1.9 x 2 x 2 cm. This previously measured 1.8 x 1.6 cm on 10/31/2018 CT. The apparent increased size may be due to true growth versus differences in modality. 2. Otherwise unremarkable renal ultrasound.   Electronically Signed By: Keith Rake M.D. On: 12/03/2019 14:28   I have personally reviewed the images and agree with radiologist interpretation.     Assessment & Plan:    1. Left renal mass Probable malignancy showing a ~3 mm growth on surveillance RUS.   Difference in size may be also partially accounted for by change in imaging modality from CT to renal ultrasound.  Again, given age and comorbidities as well as relatively slow growth rate and overall size of the lesion less than 3 cm, she remains  a good candidate for active surveillance.  Both she and her daughter are agreeable this plan.  Repeat RUS in 1 year for comparison using similar modalities.  2. rUTI  No infections since 08/2019, will treat symptoms as needed.  Continue prevention techniques including probiotics and cranberry tablets.   Advised to return to our office specifically if she has any UTI type symptoms  3. Urinary incontinence Discussed options for managing urinary incontinence which primarily would include behavioral modification, ensuring daily bowel movement as well as consideration of pharmacotherapy.  Given her confusion at times, she be best served with a beta 3 agonist.  At this point time, they do not want to pursue pharmacotherapy.  Follow up in 1 year with [facility] [address] [place], [place] [address] [phone]  I, Selena Batten, am acting as a scribe for Dr. [name].  I have reviewed the above documentation for accuracy and  completeness, and I agree with the above.   [name], MD

## 2019-12-04 ENCOUNTER — Ambulatory Visit (INDEPENDENT_AMBULATORY_CARE_PROVIDER_SITE_OTHER): Payer: Medicare Other | Admitting: Urology

## 2019-12-04 ENCOUNTER — Encounter: Payer: Self-pay | Admitting: Urology

## 2019-12-04 VITALS — BP 143/85 | HR 72 | Wt 208.0 lb

## 2019-12-04 DIAGNOSIS — N2889 Other specified disorders of kidney and ureter: Secondary | ICD-10-CM

## 2019-12-26 ENCOUNTER — Telehealth: Payer: Self-pay | Admitting: Urology

## 2019-12-26 NOTE — Telephone Encounter (Signed)
Spoke with patients daughter explained that we needed to see the patient per Judson Roch our lead CMA and she was not happy about having to bring her mother into the clinic. Stated that our policy was stupid and that we should just be able to look into her chart at the past labs and giver her the same ABX for her symptoms. I explained that we are unable to do that as we needed to look at her urine to make sure we are treating her with the correct ABX for the correct infection if she indeed had an infection. She did agree to an appointment for 12-27-19 with Sam. She was upset still about having to have an app and we could not just call something in. I did advise her to go to the urgent care or call her PCP if she felt that she was in need of immediate care today. She said she would not take her to a place like that ever.   Sharyn Lull

## 2019-12-26 NOTE — Telephone Encounter (Signed)
Pt.'s daughter called to report mother is having Dysuria and would like a prescription called in because she does not want to bring pt. To office unless absolutley necessary. Pt.'s daughter would like someone to call her at [phone].

## 2019-12-27 ENCOUNTER — Ambulatory Visit: Payer: Medicare Other | Admitting: Physician Assistant

## 2020-12-03 ENCOUNTER — Ambulatory Visit
Admission: RE | Admit: 2020-12-03 | Discharge: 2020-12-03 | Disposition: A | Discharge status: Home / Self Care | Payer: Medicare Other | Source: Ambulatory Visit | Attending: Urology | Admitting: Urology

## 2020-12-03 ENCOUNTER — Other Ambulatory Visit: Payer: Self-pay

## 2020-12-03 DIAGNOSIS — N2889 Other specified disorders of kidney and ureter: Secondary | ICD-10-CM | POA: Diagnosis not present

## 2020-12-08 NOTE — Progress Notes (Signed)
12/15/20 12:56 PM   Pamela Wolfe 02-Feb-1934 [id number]  Referring provider:  Marinda Elk, MD [facility] [place],  [place] [address] Chief Complaint  Patient presents with   Left renal mass     HPI: Pamela Wolfe is a 87 y.o.female with urinary incontinence and recurrent UTI who returns for 1 year follow-up and RUS.   She was previously seen by a urologist in [place] who offered an implantable device for management of her urinary incontinence around 2017.  She declined this.  RUS on 12/03/2020 which revealed cystic lesion within the left kidney which corresponds to that seen on prior CT and ultrasound. It appeared simple in nature on exam measuring 1.6 cm.  She is accompanies by her daughter today   She started having dysuria a couple days ago. Has had increased urinary symptoms since Thursday. Daughter states she hs low output but not been taking much water recently.   PMH: Past Medical History:  Diagnosis Date   Depression    Essential hypertension 01/26/2017   GERD (gastroesophageal reflux disease)    History of stroke 12/08/2016   Loss of memory 12/08/2016   Macular degeneration    Neck pain 12/08/2016   Recurrent major depressive disorder, in full remission ([place]) 01/26/2017   Sleep apnea    Tremor 02/06/2017    Surgical History: Past Surgical History:  Procedure Laterality Date   ABDOMINAL HYSTERECTOMY  1975   BUNIONECTOMY  2016   CATARACT EXTRACTION, BILATERAL     CHOLECYSTECTOMY     SALIVARY GLAND SURGERY      Home Medications:  Allergies as of 12/09/2020   No Known Allergies      Medication List        Accurate as of December 09, 2020 11:59 PM. If you have any questions, ask your nurse or doctor.          STOP taking these medications    Besivance 0.6 % Susp Generic drug: Besifloxacin HCl Stopped by: [name], MD   Durezol 0.05 % Emul Generic drug: Difluprednate Stopped by: [name], MD    Prolensa 0.07 % Soln Generic drug: Bromfenac Sodium Stopped by: [name], MD       TAKE these medications    amLODipine 5 MG tablet Commonly known as: NORVASC Take 1 tablet by mouth daily.   aspirin EC 81 MG tablet Take by mouth.   buPROPion 150 MG 12 hr tablet Commonly known as: WELLBUTRIN SR Take 1 tablet by mouth 2 (two) times daily.   citalopram 20 MG tablet Commonly known as: CELEXA Take by mouth.   clopidogrel 75 MG tablet Commonly known as: PLAVIX Take 1 tablet by mouth daily.   HYDROcodone bit-homatropine 5-1.5 MG/5ML syrup Commonly known as: HYCODAN Hydromet 5 mg-1.5 mg/5 mL oral syrup   losartan-hydrochlorothiazide 100-25 MG tablet Commonly known as: HYZAAR Take by mouth.   memantine 10 MG tablet Commonly known as: NAMENDA Take 1 tablet by mouth 2 (two) times daily.   pantoprazole 20 MG tablet Commonly known as: PROTONIX Take by mouth.        Allergies: No Known Allergies  Family History: Family History  Problem Relation Age of Onset   Bladder Cancer Neg Hx    Kidney cancer Neg Hx    Prostate cancer Neg Hx     Social History:  reports that she has quit smoking. She has never used smokeless tobacco. She reports current alcohol use. She reports that she does not  use drugs.   Physical Exam: BP (!) 156/83   Pulse 69   Ht '5\' 3"'$  (1.6 m)   BMI 36.85 kg/m   Constitutional:  Alert and oriented, No acute distress. HEENT: [place] AT, moist mucus membranes.  Trachea midline, no masses. Cardiovascular: No clubbing, cyanosis, or edema. Respiratory: Normal respiratory effort, no increased work of breathing. Skin: No rashes, bruises or suspicious lesions. Neurologic: Grossly intact, no focal deficits, moving all 4 extremities. Psychiatric: Normal mood and affect.  Laboratory Data:  Lab Results  Component Value Date   CREATININE 0.90 10/31/2018     Urinalysis Results for orders placed or performed in visit on 12/09/20  Urine culture    Specimen: Urine   Urine  Result Value Ref Range   Urine Culture, Routine Final report (A)    Organism ID, Bacteria Escherichia coli (A)    Antimicrobial Susceptibility Comment   Microscopic Examination   Urine  Result Value Ref Range   WBC, UA 6-10 (A) 0 - 5 /hpf   RBC None seen 0 - 2 /hpf   Epithelial Cells (non renal) 0-10 0 - 10 /hpf   Bacteria, UA Moderate (A) None seen/Few  Urinalysis, Complete  Result Value Ref Range   Specific Gravity, UA 1.015 1.005 - 1.030   pH, UA 6.5 5.0 - 7.5   Color, UA Yellow Yellow   Appearance Ur Clear Clear   Leukocytes,UA Negative Negative   Protein,UA Negative Negative/Trace   Glucose, UA Negative Negative   Ketones, UA Negative Negative   RBC, UA Negative Negative   Bilirubin, UA Negative Negative   Urobilinogen, Ur 0.2 0.2 - 1.0 mg/dL   Nitrite, UA Positive (A) Negative   Microscopic Examination See below:      Pertinent Imaging: CLINICAL DATA:  Follow-up left renal lesion   EXAM: RENAL / URINARY TRACT ULTRASOUND COMPLETE   COMPARISON:  12/03/2019   FINDINGS: Right Kidney:   Renal measurements: 10.1 x 4.9 x 4.4 cm. = volume: 114 mL. Echogenicity within normal limits. No mass or hydronephrosis visualized.   Left Kidney:   Renal measurements: 9.9 x 5.0 x 4.8 cm. = volume: 125 mL. 1.6 cm hypoechoic lesion is noted within the left kidney similar to that seen on prior ultrasound CT examination. It is better visualized on the current exam as a simply cystic lesion.   Bladder:   Appears normal for degree of bladder distention.   Other:   None.   IMPRESSION: Cystic lesion within the left kidney which corresponds to that seen on prior CT and ultrasound. It appears simple in nature on today's exam measuring 1.6 cm.     Electronically Signed   By: Inez Catalina M.D.   On: 12/05/2020 01:15   I have personally reviewed the images and agree with radiologist interpretation.   Urinalysis: 6-10 WBC, and trace nitrite.     Assessment & Plan:    Left renal lesion - Unchanged in size and appear more simple today which is reassuring, previously was concerning for possible - will continue to with annual RUS due to its history of being more complex    Recurrent UTI / dysuria - Urine today mildly suspicious but symptoms are relatively mild -We will culture and treat as needed -Advised to call if her symptoms worsen in the interim to go ahead and start treatment earlier   Follow up in 1 year with RUS   Conley Rolls as a scribe for [name], MD.,have documented all relevant documentation on the  behalf of [name], MD,as directed by  [name], MD while in the presence of [name], MD.  I have reviewed the above documentation for accuracy and completeness, and I agree with the above.   [name], MD  [facility] [address] [place], [place] [address] [phone]

## 2020-12-09 ENCOUNTER — Ambulatory Visit (INDEPENDENT_AMBULATORY_CARE_PROVIDER_SITE_OTHER): Payer: Medicare Other | Admitting: Urology

## 2020-12-09 ENCOUNTER — Encounter: Payer: Self-pay | Admitting: Urology

## 2020-12-09 ENCOUNTER — Other Ambulatory Visit: Payer: Self-pay

## 2020-12-09 VITALS — BP 156/83 | HR 69 | Ht 63.0 in

## 2020-12-09 DIAGNOSIS — N2889 Other specified disorders of kidney and ureter: Secondary | ICD-10-CM | POA: Diagnosis not present

## 2020-12-09 DIAGNOSIS — R300 Dysuria: Secondary | ICD-10-CM | POA: Diagnosis not present

## 2020-12-10 LAB — URINALYSIS, COMPLETE
Bilirubin, UA: NEGATIVE
Glucose, UA: NEGATIVE
Ketones, UA: NEGATIVE
Leukocytes,UA: NEGATIVE
Nitrite, UA: POSITIVE — AB
Protein,UA: NEGATIVE
RBC, UA: NEGATIVE
Specific Gravity, UA: 1.015 (ref 1.005–1.030)
Urobilinogen, Ur: 0.2 mg/dL (ref 0.2–1.0)
pH, UA: 6.5 (ref 5.0–7.5)

## 2020-12-10 LAB — MICROSCOPIC EXAMINATION: RBC, Urine: NONE SEEN /hpf (ref 0–2)

## 2020-12-12 LAB — URINE CULTURE

## 2020-12-15 ENCOUNTER — Telehealth: Payer: Self-pay | Admitting: *Deleted

## 2020-12-15 MED ORDER — SULFAMETHOXAZOLE-TRIMETHOPRIM 800-160 MG PO TABS
1.0000 | ORAL_TABLET | Freq: Two times a day (BID) | ORAL | 0 refills | Status: DC
Start: 2020-12-15 — End: 2021-05-21

## 2020-12-15 NOTE — Telephone Encounter (Addendum)
Left patient a Vm with details, asked to return call with any questions. Sent in Bactrim to [facility]   ----- Message from [name], MD sent at 12/15/2020  8:32 AM EDT ----- Please treat with bactrim ds bid x 5 days  [name], MD

## 2020-12-15 NOTE — Telephone Encounter (Signed)
Pt returned call, RX needs to be sent to [facility] on [address].

## 2021-05-20 ENCOUNTER — Inpatient Hospital Stay
Admission: EM | Admit: 2021-05-20 | Discharge: 2021-05-22 | DRG: 291 | Disposition: A | Discharge status: Home Health | Payer: Medicare Other | Attending: Student | Admitting: Student

## 2021-05-20 ENCOUNTER — Emergency Department: Payer: Medicare Other

## 2021-05-20 ENCOUNTER — Other Ambulatory Visit: Payer: Self-pay

## 2021-05-20 DIAGNOSIS — E669 Obesity, unspecified: Secondary | ICD-10-CM | POA: Diagnosis present

## 2021-05-20 DIAGNOSIS — K219 Gastro-esophageal reflux disease without esophagitis: Secondary | ICD-10-CM | POA: Diagnosis present

## 2021-05-20 DIAGNOSIS — I10 Essential (primary) hypertension: Secondary | ICD-10-CM | POA: Diagnosis present

## 2021-05-20 DIAGNOSIS — Z79899 Other long term (current) drug therapy: Secondary | ICD-10-CM | POA: Diagnosis not present

## 2021-05-20 DIAGNOSIS — J810 Acute pulmonary edema: Secondary | ICD-10-CM

## 2021-05-20 DIAGNOSIS — I110 Hypertensive heart disease with heart failure: Principal | ICD-10-CM | POA: Diagnosis present

## 2021-05-20 DIAGNOSIS — Z6839 Body mass index (BMI) 39.0-39.9, adult: Secondary | ICD-10-CM | POA: Diagnosis not present

## 2021-05-20 DIAGNOSIS — T502X5A Adverse effect of carbonic-anhydrase inhibitors, benzothiadiazides and other diuretics, initial encounter: Secondary | ICD-10-CM | POA: Diagnosis present

## 2021-05-20 DIAGNOSIS — F419 Anxiety disorder, unspecified: Secondary | ICD-10-CM | POA: Diagnosis not present

## 2021-05-20 DIAGNOSIS — F32A Depression, unspecified: Secondary | ICD-10-CM

## 2021-05-20 DIAGNOSIS — Z20822 Contact with and (suspected) exposure to covid-19: Secondary | ICD-10-CM | POA: Diagnosis present

## 2021-05-20 DIAGNOSIS — I248 Other forms of acute ischemic heart disease: Secondary | ICD-10-CM | POA: Diagnosis present

## 2021-05-20 DIAGNOSIS — R413 Other amnesia: Secondary | ICD-10-CM

## 2021-05-20 DIAGNOSIS — E876 Hypokalemia: Secondary | ICD-10-CM | POA: Diagnosis present

## 2021-05-20 DIAGNOSIS — H3530 Unspecified macular degeneration: Secondary | ICD-10-CM | POA: Diagnosis present

## 2021-05-20 DIAGNOSIS — F0390 Unspecified dementia without behavioral disturbance: Secondary | ICD-10-CM | POA: Diagnosis not present

## 2021-05-20 DIAGNOSIS — R778 Other specified abnormalities of plasma proteins: Secondary | ICD-10-CM | POA: Diagnosis not present

## 2021-05-20 DIAGNOSIS — Z7982 Long term (current) use of aspirin: Secondary | ICD-10-CM | POA: Diagnosis not present

## 2021-05-20 DIAGNOSIS — J9601 Acute respiratory failure with hypoxia: Secondary | ICD-10-CM | POA: Diagnosis present

## 2021-05-20 DIAGNOSIS — F0393 Unspecified dementia, unspecified severity, with mood disturbance: Secondary | ICD-10-CM | POA: Diagnosis present

## 2021-05-20 DIAGNOSIS — I452 Bifascicular block: Secondary | ICD-10-CM | POA: Diagnosis present

## 2021-05-20 DIAGNOSIS — Z7902 Long term (current) use of antithrombotics/antiplatelets: Secondary | ICD-10-CM

## 2021-05-20 DIAGNOSIS — Z87891 Personal history of nicotine dependence: Secondary | ICD-10-CM | POA: Diagnosis not present

## 2021-05-20 DIAGNOSIS — I5031 Acute diastolic (congestive) heart failure: Secondary | ICD-10-CM | POA: Diagnosis present

## 2021-05-20 DIAGNOSIS — I509 Heart failure, unspecified: Secondary | ICD-10-CM

## 2021-05-20 DIAGNOSIS — Z66 Do not resuscitate: Secondary | ICD-10-CM | POA: Diagnosis present

## 2021-05-20 DIAGNOSIS — Z90710 Acquired absence of both cervix and uterus: Secondary | ICD-10-CM

## 2021-05-20 DIAGNOSIS — R5381 Other malaise: Secondary | ICD-10-CM | POA: Diagnosis present

## 2021-05-20 DIAGNOSIS — Z8673 Personal history of transient ischemic attack (TIA), and cerebral infarction without residual deficits: Secondary | ICD-10-CM

## 2021-05-20 LAB — COMPREHENSIVE METABOLIC PANEL
ALT: 18 U/L (ref 0–44)
AST: 17 U/L (ref 15–41)
Albumin: 3.7 g/dL (ref 3.5–5.0)
Alkaline Phosphatase: 75 U/L (ref 38–126)
Anion gap: 11 (ref 5–15)
BUN: 15 mg/dL (ref 8–23)
CO2: 26 mmol/L (ref 22–32)
Calcium: 9.1 mg/dL (ref 8.9–10.3)
Chloride: 101 mmol/L (ref 98–111)
Creatinine, Ser: 0.74 mg/dL (ref 0.44–1.00)
GFR, Estimated: >60 mL/min (ref >60)
Glucose, Bld: 128 mg/dL — ABNORMAL HIGH (ref 70–99)
Potassium: 3.1 mmol/L — ABNORMAL LOW (ref 3.5–5.1)
Sodium: 138 mmol/L (ref 135–145)
Total Bilirubin: 1.1 mg/dL (ref 0.3–1.2)
Total Protein: 6.3 g/dL — ABNORMAL LOW (ref 6.5–8.1)

## 2021-05-20 LAB — RESP PANEL BY RT-PCR (FLU A&B, COVID) ARPGX2
Influenza A by PCR: NEGATIVE
Influenza B by PCR: NEGATIVE
SARS Coronavirus 2 by RT PCR: NEGATIVE

## 2021-05-20 LAB — TROPONIN I (HIGH SENSITIVITY)
Troponin I (High Sensitivity): 34 ng/L — ABNORMAL HIGH (ref <18)
Troponin I (High Sensitivity): 61 ng/L — ABNORMAL HIGH (ref <18)

## 2021-05-20 LAB — PROCALCITONIN: Procalcitonin: <0.1 ng/mL

## 2021-05-20 LAB — CBC WITH DIFFERENTIAL/PLATELET
Abs Immature Granulocytes: 0.04 10*3/uL (ref 0.00–0.07)
Basophils Absolute: 0 10*3/uL (ref 0.0–0.1)
Basophils Relative: 0 %
Eosinophils Absolute: 0.1 10*3/uL (ref 0.0–0.5)
Eosinophils Relative: 1 %
HCT: 38.9 % (ref 36.0–46.0)
Hemoglobin: 13.1 g/dL (ref 12.0–15.0)
Immature Granulocytes: 0 %
Lymphocytes Relative: 13 %
Lymphs Abs: 1.4 10*3/uL (ref 0.7–4.0)
MCH: 31.3 pg (ref 26.0–34.0)
MCHC: 33.7 g/dL (ref 30.0–36.0)
MCV: 93.1 fL (ref 80.0–100.0)
Monocytes Absolute: 0.8 10*3/uL (ref 0.1–1.0)
Monocytes Relative: 7 %
Neutro Abs: 8.3 10*3/uL — ABNORMAL HIGH (ref 1.7–7.7)
Neutrophils Relative %: 79 %
Platelets: 278 10*3/uL (ref 150–400)
RBC: 4.18 MIL/uL (ref 3.87–5.11)
RDW: 13.7 % (ref 11.5–15.5)
WBC: 10.6 10*3/uL — ABNORMAL HIGH (ref 4.0–10.5)
nRBC: 0 % (ref 0.0–0.2)

## 2021-05-20 LAB — D-DIMER, QUANTITATIVE: D-Dimer, Quant: 0.8 ug/mL-FEU — ABNORMAL HIGH (ref 0.00–0.50)

## 2021-05-20 LAB — BRAIN NATRIURETIC PEPTIDE: B Natriuretic Peptide: 256.5 pg/mL — ABNORMAL HIGH (ref 0.0–100.0)

## 2021-05-20 MED ORDER — FUROSEMIDE 10 MG/ML IJ SOLN
40.0000 mg | Freq: Once | INTRAMUSCULAR | Status: AC
  Administered 2021-05-20: 40 mg via INTRAVENOUS
  Filled 2021-05-20: qty 4

## 2021-05-20 MED ORDER — IPRATROPIUM-ALBUTEROL 0.5-2.5 (3) MG/3ML IN SOLN
3.0000 mL | RESPIRATORY_TRACT | Status: DC | PRN
  Administered 2021-05-22: 3 mL via RESPIRATORY_TRACT
  Filled 2021-05-20: qty 3

## 2021-05-20 MED ORDER — ASPIRIN EC 81 MG PO TBEC
81.0000 mg | DELAYED_RELEASE_TABLET | Freq: Every day | ORAL | Status: DC
  Administered 2021-05-21 – 2021-05-22 (×2): 81 mg via ORAL
  Filled 2021-05-20 (×2): qty 1

## 2021-05-20 MED ORDER — LOSARTAN POTASSIUM 25 MG PO TABS
25.0000 mg | ORAL_TABLET | Freq: Every day | ORAL | Status: DC
  Administered 2021-05-21 – 2021-05-22 (×2): 25 mg via ORAL
  Filled 2021-05-20 (×2): qty 1

## 2021-05-20 MED ORDER — ACETAMINOPHEN 325 MG PO TABS: 650.0000 mg | ORAL_TABLET | ORAL | Status: DC | PRN

## 2021-05-20 MED ORDER — FUROSEMIDE 10 MG/ML IJ SOLN
40.0000 mg | Freq: Two times a day (BID) | INTRAMUSCULAR | Status: DC
  Filled 2021-05-20: qty 4

## 2021-05-20 MED ORDER — ONDANSETRON HCL 4 MG/2ML IJ SOLN: 4.0000 mg | Freq: Four times a day (QID) | INTRAMUSCULAR | Status: DC | PRN

## 2021-05-20 MED ORDER — SODIUM CHLORIDE 0.9% FLUSH
3.0000 mL | Freq: Two times a day (BID) | INTRAVENOUS | Status: DC
  Administered 2021-05-21 – 2021-05-22 (×3): 3 mL via INTRAVENOUS

## 2021-05-20 MED ORDER — NITROGLYCERIN 2 % TD OINT
1.0000 [in_us] | TOPICAL_OINTMENT | Freq: Once | TRANSDERMAL | Status: AC
  Administered 2021-05-20: 1 [in_us] via TOPICAL
  Filled 2021-05-20: qty 1

## 2021-05-20 MED ORDER — SODIUM CHLORIDE 0.9% FLUSH: 3.0000 mL | INTRAVENOUS | Status: DC | PRN

## 2021-05-20 MED ORDER — NITROGLYCERIN 0.4 MG SL SUBL
0.4000 mg | SUBLINGUAL_TABLET | Freq: Once | SUBLINGUAL | Status: AC
  Administered 2021-05-20: 0.4 mg via SUBLINGUAL
  Filled 2021-05-20: qty 1

## 2021-05-20 MED ORDER — ENOXAPARIN SODIUM 60 MG/0.6ML IJ SOSY
0.5000 mg/kg | PREFILLED_SYRINGE | INTRAMUSCULAR | Status: DC
  Administered 2021-05-20: 47.5 mg via SUBCUTANEOUS
  Filled 2021-05-20: qty 0.6

## 2021-05-20 MED ORDER — SODIUM CHLORIDE 0.9 % IV SOLN: 250.0000 mL | INTRAVENOUS | Status: DC | PRN

## 2021-05-20 NOTE — Progress Notes (Signed)
Anticoagulation monitoring(Lovenox):  88 yo female ordered Lovenox 40 mg Q24h    Filed Weights   05/20/21 1941  Weight: 97.3 kg (214 lb 8.1 oz)   BMI 39.23   Lab Results  Component Value Date   CREATININE 0.74 05/20/2021   CREATININE 0.90 10/31/2018   CREATININE 0.80 10/05/2017   Estimated Creatinine Clearance: 55 mL/min (by C-G formula based on SCr of 0.74 mg/dL). Hemoglobin & Hematocrit     Component Value Date/Time   HGB 13.1 05/20/2021 1946   HCT 38.9 05/20/2021 1946     Per Protocol for Patient with estCrcl > 30 ml/min and BMI > 30, will transition to Lovenox 47.5 mg Q24h.

## 2021-05-20 NOTE — H&P (Signed)
History and Physical    Pamela Wolfe [id number] DOB: [birth year] DOA: 05/20/2021  PCP: Marinda Elk, MD   Patient coming from: home  I have personally briefly reviewed patient's relevant medical records in [place]  Chief Complaint: shortness of breath  HPI: Pamela Wolfe is a 88 y.o. female with medical history significant for HTN, depression, memory loss/dementia and prior history of stroke who was brought in and acute respiratory distress with EMS reporting O2 sats in the 80s on room air requiring NRB.  EMS found her to be wheezing and administered 2 albuterol treatments as well as IV Solu-Medrol and magnesium.  Patient reportedly had lower extremity edema but have been increasing over the past several days but denied leg pain.  Had no cough, fever or chills, denied nausea, vomiting, abdominal pain or diarrhea  ED course: On arrival afebrile, tachycardic to 108 and tachypneic to 25 with O2 sat 100% on NRB.  With treatment in the ED she was able to be weaned down to 4 L and maintain sats in the mid 90s Blood work: Troponin 34, BNP 256, potassium 3.1, WBC 10.6 COVID and flu negative  EKG, personally viewed and interpreted: Sinus tachycardia at 106 with RBBB and LAFB and no acute ST-T wave changes  Chest x-ray with vascular congestion and edema consistent with CHF Left lower extremity ultrasound: No evidence of DVT  Patient treated with IV Lasix, Nitropaste with improvement in symptoms.  She was able to be weaned from NRB to 4 L O2 to maintain sats in the mid 90s Hospitalist consulted for admission.   Review of Systems: As per HPI otherwise all other systems on review of systems negative.   Assessment/Plan    Acute respiratory failure with hypoxia (HCC) -Secondary to flash pulmonary edema, possible acute bronchitis. Continuing eval to evaluate for ACS and PE,  --trending troponin and D-dimer --supplemental O2 to keep sats over 94    Flash pulmonary edema (HCC)    Acute heart failure (HCC) --Trend troponins --IV lasix, continue losartan --Daily weights and intake and output monitoring --Echo  Elevated troponin - Suspect demand ischemia.  Patient denies chest pain and EKG remains nonacute - Troponin 34-61 -Aspirin and Heparin infusion, discussed with granddaughter and POA - Cardiology consult    Essential hypertension --Holding amlodipine and HCTZ    History of stroke --clopidogrel, aspirin --statin not on med list, pending med rec    Memory loss/dementia --Continue namenda    Depression --Continue celexa and wellbutrin    DVT prophylaxis: Lovenox  Code Status: DNR Family Communication: Granddaughter and POA Disposition Plan: Back to previous home environment Consults called: none  Status:At the time of admission, it appears that the appropriate admission status for this patient is INPATIENT. This is judged to be reasonable and necessary in order to provide the required intensity of service to ensure the patient's safety given the presenting symptoms, physical exam findings, and initial radiographic and laboratory data in the context of their  Comorbid conditions.   Patient requires inpatient status due to high intensity of service, high risk for further deterioration and high frequency of surveillance required.   I certify that at the point of admission it is my clinical judgment that the patient will require inpatient hospital care spanning beyond 2 midnights  Physical Exam: Vitals:   05/20/21 2000 05/20/21 2037 05/20/21 2100 05/20/21 2130  BP: 133/74 133/69 134/67 118/70  Pulse: (!) 106 (!) 101 99 95  Resp: (!) 25 (!) 25 (!) 23  20  Temp:      SpO2: 98% 95% 98% 94%  Weight:      Height:       Constitutional: Alert, oriented x 3 . Not in any apparent distress HEENT:      Head: Normocephalic and atraumatic.         Eyes: PERLA, EOMI, Conjunctivae are normal. Sclera is non-icteric.       Mouth/Throat: Mucous membranes are moist.        Neck: Supple with no signs of meningismus. Cardiovascular: Regular rate and rhythm. No murmurs, gallops, or rubs. 2+ symmetrical distal pulses are present . No JVD.  2+ LE edema Respiratory: Respiratory effort increased with bibasilar crackles Gastrointestinal: Soft, non tender, non distended. Positive bowel sounds.  Genitourinary: No CVA tenderness. Musculoskeletal: Nontender with normal range of motion in all extremities. No cyanosis, or erythema of extremities. Neurologic:  Face is symmetric. Moving all extremities. No gross focal neurologic deficits . Skin: Skin is warm, dry.  No rash or ulcers Psychiatric: Mood and affect are appropriate     Past Medical History:  Diagnosis Date   Depression    Essential hypertension 01/26/2017   GERD (gastroesophageal reflux disease)    History of stroke 12/08/2016   Loss of memory 12/08/2016   Macular degeneration    Neck pain 12/08/2016   Recurrent major depressive disorder, in full remission ([place]) 01/26/2017   Sleep apnea    Tremor 02/06/2017    Past Surgical History:  Procedure Laterality Date   ABDOMINAL HYSTERECTOMY  1975   BUNIONECTOMY  2016   CATARACT EXTRACTION, BILATERAL     CHOLECYSTECTOMY     SALIVARY GLAND SURGERY       reports that she has quit smoking. She has never used smokeless tobacco. She reports current alcohol use. She reports that she does not use drugs.  No Known Allergies  Family History  Problem Relation Age of Onset   Bladder Cancer Neg Hx    Kidney cancer Neg Hx    Prostate cancer Neg Hx       Prior to Admission medications   Medication Sig Start Date End Date Taking? Authorizing Provider  amLODipine (NORVASC) 5 MG tablet Take 1 tablet by mouth daily. 10/16/20   [provider]  aspirin EC 81 MG tablet Take by mouth.    [provider]  buPROPion (WELLBUTRIN SR) 150 MG 12 hr tablet Take 1 tablet by mouth 2 (two) times daily. 10/16/20   [provider]  citalopram (CELEXA) 20  MG tablet Take by mouth.    [provider]  clopidogrel (PLAVIX) 75 MG tablet Take 1 tablet by mouth daily. 10/16/20   [provider]  HYDROcodone bit-homatropine (HYCODAN) 5-1.5 MG/5ML syrup Hydromet 5 mg-1.5 mg/5 mL oral syrup    [provider]  losartan-hydrochlorothiazide (HYZAAR) 100-25 MG tablet Take by mouth. 01/26/17   [provider]  meloxicam (MOBIC) 7.5 MG tablet Take 7.5 mg by mouth daily. 04/02/21   [provider]  memantine (NAMENDA) 10 MG tablet Take 1 tablet by mouth 2 (two) times daily. 10/14/20   [provider]  pantoprazole (PROTONIX) 20 MG tablet Take by mouth. 01/26/17   [provider]  sulfamethoxazole-trimethoprim (BACTRIM DS) 800-160 MG tablet Take 1 tablet by mouth every 12 (twelve) hours. 12/15/20   [name], MD      Labs on Admission: I have personally reviewed following labs and imaging studies  CBC: Recent Labs  Lab 05/20/21 1946  WBC  10.6*  NEUTROABS 8.3*  HGB 13.1  HCT 38.9  MCV 93.1  PLT 782   Basic Metabolic Panel: Recent Labs  Lab 05/20/21 1946  NA 138  K 3.1*  CL 101  CO2 26  GLUCOSE 128*  BUN 15  CREATININE 0.74  CALCIUM 9.1   GFR: Estimated Creatinine Clearance: 55 mL/min (by C-G formula based on SCr of 0.74 mg/dL). Liver Function Tests: Recent Labs  Lab 05/20/21 1946  AST 17  ALT 18  ALKPHOS 75  BILITOT 1.1  PROT 6.3*  ALBUMIN 3.7   No results for input(s): LIPASE, AMYLASE in the last 168 hours. No results for input(s): AMMONIA in the last 168 hours. Coagulation Profile: No results for input(s): INR, PROTIME in the last 168 hours. Cardiac Enzymes: No results for input(s): CKTOTAL, CKMB, CKMBINDEX, TROPONINI in the last 168 hours. BNP (last 3 results) No results for input(s): PROBNP in the last 8760 hours. HbA1C: No results for input(s): HGBA1C in the last 72 hours. CBG: No results for input(s): GLUCAP in the last 168 hours. Lipid Profile: No  results for input(s): CHOL, HDL, LDLCALC, TRIG, CHOLHDL, LDLDIRECT in the last 72 hours. Thyroid Function Tests: No results for input(s): TSH, T4TOTAL, FREET4, T3FREE, THYROIDAB in the last 72 hours. Anemia Panel: No results for input(s): VITAMINB12, FOLATE, FERRITIN, TIBC, IRON, RETICCTPCT in the last 72 hours. Urine analysis:    Component Value Date/Time   APPEARANCEUR Clear 12/09/2020 1546   GLUCOSEU Negative 12/09/2020 1546   BILIRUBINUR Negative 12/09/2020 1546   PROTEINUR Negative 12/09/2020 1546   NITRITE Positive (A) 12/09/2020 1546   LEUKOCYTESUR Negative 12/09/2020 1546    Radiological Exams on Admission: US Venous Img Lower Unilateral Left  Result Date: 05/20/2021 CLINICAL DATA:  Left leg pain and swelling for several months, initial encounter EXAM: LEFT LOWER EXTREMITY VENOUS DOPPLER ULTRASOUND TECHNIQUE: Gray-scale sonography with graded compression, as well as color Doppler and duplex ultrasound were performed to evaluate the lower extremity deep venous systems from the level of the common femoral vein and including the common femoral, femoral, profunda femoral, popliteal and calf veins including the posterior tibial, peroneal and gastrocnemius veins when visible. The superficial great saphenous vein was also interrogated. Spectral Doppler was utilized to evaluate flow at rest and with distal augmentation maneuvers in the common femoral, femoral and popliteal veins. COMPARISON:  None. FINDINGS: Contralateral Common Femoral Vein: Respiratory phasicity is normal and symmetric with the symptomatic side. No evidence of thrombus. Normal compressibility. Common Femoral Vein: No evidence of thrombus. Normal compressibility, respiratory phasicity and response to augmentation. Saphenofemoral Junction: No evidence of thrombus. Normal compressibility and flow on color Doppler imaging. Profunda Femoral Vein: No evidence of thrombus. Normal compressibility and flow on color Doppler imaging.  Femoral Vein: No evidence of thrombus. Normal compressibility, respiratory phasicity and response to augmentation. Popliteal Vein: No evidence of thrombus. Normal compressibility, respiratory phasicity and response to augmentation. Calf Veins: No evidence of thrombus. Normal compressibility and flow on color Doppler imaging. Superficial Great Saphenous Vein: No evidence of thrombus. Normal compressibility. Venous Reflux:  None. Other Findings:  None. IMPRESSION: No evidence of deep venous thrombosis. Electronically Signed   By: Inez Catalina M.D.   On: 05/20/2021 21:15   DG Chest Portable 1 View  Result Date: 05/20/2021 CLINICAL DATA:  Shortness of breath EXAM: PORTABLE CHEST 1 VIEW COMPARISON:  None. FINDINGS: Cardiac shadow is at the upper limits of normal in size. Aortic calcifications are noted. Vascular congestion is noted with interstitial and parenchymal edema.  Mild basilar atelectasis is noted slightly worse on the right than the left. No bony abnormality is noted. IMPRESSION: Vascular congestion and edema consistent with CHF. Bibasilar atelectatic changes are noted. Electronically Signed   By: Inez Catalina M.D.   On: 05/20/2021 20:06       Athena Masse MD [facility]   05/20/2021, 10:08 PM

## 2021-05-20 NOTE — ED Provider Notes (Signed)
[facility] Provider Note    Event Date/Time   First MD Initiated Contact with Patient 05/20/21 2208     (approximate)   History   CC: shortness of breath  HPI  Pamela Wolfe is a 88 y.o. female with a history of hypertension and stroke who comes ED complaining of shortness of breath.  Patient reports that she has been having gradual worsening of shortness of breath for the last few days and gradual onset of lower extremity edema for the last week or 2.  This evening, shortness of breath became acutely worse and severe.  Denies chest pain.  No cough or fever.  EMS report that initial oxygen saturation was 80% on room air.  They gave 2 albuterol treatments, 2 duo nebs, 120 mg of Solu-Medrol, 2 g of IV magnesium and transported patient on nonrebreather oxygen.  On arrival, patient states that her breathing is starting to feel a little bit better.  Denies history of COPD or heart failure.     Physical Exam   Triage Vital Signs: ED Triage Vitals  Enc Vitals Group     BP 05/20/21 1940 (!) 147/74     Pulse Rate 05/20/21 1940 (!) 108     Resp 05/20/21 1940 20     Temp 05/20/21 1940 97.9 F (36.6 C)     Temp src --      SpO2 05/20/21 1937 98 %     Weight 05/20/21 1941 214 lb 8.1 oz (97.3 kg)     Height 05/20/21 1941 5\' 2"  (1.575 m)     Head Circumference --      Peak Flow --      Pain Score 05/20/21 1941 0     Pain Loc --      Pain Edu? --      Excl. in [place]? --     Most recent vital signs: Vitals:   05/20/21 2100 05/20/21 2130  BP: 134/67 118/70  Pulse: 99 95  Resp: (!) 23 20  Temp:    SpO2: 98% 94%     General: Awake, mild respiratory distress CV:  Good peripheral perfusion.  Tachycardia, heart rate 105.  Symmetric pulses Resp:  Normal effort.  Tachypnea.  Good air entry in all lung fields.  No significant wheezing, normal expiratory phase Abd:  No distention.  Soft nontender Other:  Trace pitting edema bilateral lower extremities, left  greater than right.  No calf tenderness   ED Results / Procedures / Treatments   Labs (all labs ordered are listed, but only abnormal results are displayed) Labs Reviewed  COMPREHENSIVE METABOLIC PANEL - Abnormal; Notable for the following components:      Result Value   Potassium 3.1 (*)    Glucose, Bld 128 (*)    Total Protein 6.3 (*)    All other components within normal limits  BRAIN NATRIURETIC PEPTIDE - Abnormal; Notable for the following components:   B Natriuretic Peptide 256.5 (*)    All other components within normal limits  CBC WITH DIFFERENTIAL/PLATELET - Abnormal; Notable for the following components:   WBC 10.6 (*)    Neutro Abs 8.3 (*)    All other components within normal limits  TROPONIN I (HIGH SENSITIVITY) - Abnormal; Notable for the following components:   Troponin I (High Sensitivity) 34 (*)    All other components within normal limits  RESP PANEL BY RT-PCR (FLU A&B, COVID) [id number]  D-DIMER, QUANTITATIVE  PROCALCITONIN  TROPONIN I (HIGH SENSITIVITY)  EKG Interpreted by me Sinus tachycardia rate 106.  Normal axis and intervals.  Right bundle branch block.  Normal ST segments and T waves.  No acute ischemic changes.    RADIOLOGY Chest x-ray viewed and interpreted by me, shows scant infiltrates consistent with pulmonary edema.  Radiology report reviewed.  Ultrasound left lower extremity negative for DVT.    PROCEDURES:  Critical Care performed: Yes, see critical care procedure note(s)  .Critical Care Performed by: Carrie Mew, MD Authorized by: Carrie Mew, MD   Critical care provider statement:    Critical care time (minutes):  35   Critical care time was exclusive of:  Separately billable procedures and treating other patients   Critical care was necessary to treat or prevent imminent or life-threatening deterioration of the following conditions:  Respiratory failure and cardiac failure   Critical care was time spent personally  by me on the following activities:  Development of treatment plan with patient or surrogate, discussions with consultants, evaluation of patient's response to treatment, examination of patient, obtaining history from patient or surrogate, ordering and performing treatments and interventions, ordering and review of laboratory studies, ordering and review of radiographic studies, pulse oximetry, re-evaluation of patient's condition and review of old charts   Care discussed with: admitting provider   Comments:         .1-3 Lead EKG Interpretation Performed by: Carrie Mew, MD Authorized by: Carrie Mew, MD     Interpretation: abnormal     ECG rate:  110   ECG rate assessment: tachycardic     Rhythm: sinus rhythm     Ectopy: none     Conduction: normal     MEDICATIONS ORDERED IN ED: Medications  nitroGLYCERIN (NITROGLYN) 2 % ointment 1 inch (1 inch Topical Given 05/20/21 2024)  nitroGLYCERIN (NITROSTAT) SL tablet 0.4 mg (0.4 mg Sublingual Given 05/20/21 2025)  furosemide (LASIX) injection 40 mg (40 mg Intravenous Given 05/20/21 2025)     IMPRESSION / MDM / [place] / ED COURSE  I reviewed the triage vital signs and the nursing notes.                              Differential diagnosis includes, but is not limited to, acute pulmonary edema, pleural effusion, pneumothorax, non-STEMI, viral illness.  Doubt sepsis   The patient is on the cardiac monitor to evaluate for evidence of arrhythmia and/or significant heart rate changes.  Patient presents with respiratory distress, hypoxic to 80% on room air.  Clinically improving after bronchodilators.  Chest x-ray consistent with pulmonary edema, possible heart failure.  COVID and flu negative.  BNP and troponin are slightly elevated.  Chemistry and CBC are normal.   ----------------------------------------- 10:42 PM on 05/20/2021 -----------------------------------------  Patient given nitroglycerin tablet and  ointment, Lasix injection.  Able to transition to 4 L nasal cannula.  Case discussed with hospitalist for further management.      FINAL CLINICAL IMPRESSION(S) / ED DIAGNOSES   Final diagnoses:  Acute respiratory failure with hypoxia ([place])  Acute pulmonary edema ([place])     Rx / DC Orders   ED Discharge Orders     None        Note:  This document was prepared using Dragon voice recognition software and may include unintentional dictation errors.   Carrie Mew, MD 05/20/21 2242

## 2021-05-20 NOTE — ED Notes (Signed)
Pt placed on 4L [place] by Joni Fears MD. Pt O2 sat 93%

## 2021-05-20 NOTE — ED Triage Notes (Signed)
Pt arrives from home via [facility] with SOB. Per EMS, pt had sudden onset of SOB and had expiratory wheezing throughout all lobes and satting 80% on RA.EMS placed pt on nonrebreather and pt o2 sat 96%. EMS gave 2 albuterol treatments, 2 duonebs, 120mg  solumedrol, and 2g of mag

## 2021-05-21 ENCOUNTER — Inpatient Hospital Stay: Admit: 2021-05-21 | Discharge: 2021-05-21 | Disposition: A | Discharge status: Home / Self Care | Payer: Medicare Other

## 2021-05-21 ENCOUNTER — Encounter: Payer: Self-pay | Admitting: Internal Medicine

## 2021-05-21 DIAGNOSIS — F419 Anxiety disorder, unspecified: Secondary | ICD-10-CM

## 2021-05-21 DIAGNOSIS — E876 Hypokalemia: Secondary | ICD-10-CM

## 2021-05-21 DIAGNOSIS — I509 Heart failure, unspecified: Secondary | ICD-10-CM | POA: Diagnosis not present

## 2021-05-21 DIAGNOSIS — J9601 Acute respiratory failure with hypoxia: Secondary | ICD-10-CM | POA: Diagnosis not present

## 2021-05-21 DIAGNOSIS — I10 Essential (primary) hypertension: Secondary | ICD-10-CM | POA: Diagnosis not present

## 2021-05-21 DIAGNOSIS — F0390 Unspecified dementia without behavioral disturbance: Secondary | ICD-10-CM

## 2021-05-21 DIAGNOSIS — R778 Other specified abnormalities of plasma proteins: Secondary | ICD-10-CM

## 2021-05-21 DIAGNOSIS — F32A Depression, unspecified: Secondary | ICD-10-CM

## 2021-05-21 LAB — APTT: aPTT: 31 seconds (ref 24–36)

## 2021-05-21 LAB — ECHOCARDIOGRAM COMPLETE
AR max vel: 2.24 cm2
AV Area VTI: 2.64 cm2
AV Area mean vel: 2.29 cm2
AV Mean grad: 4 mmHg
AV Peak grad: 6.3 mmHg
Ao pk vel: 1.25 m/s
Area-P 1/2: 3.33 cm2
Height: 62 in
MV VTI: 2.61 cm2
S' Lateral: 3 cm
Weight: 3432.12 oz

## 2021-05-21 LAB — BASIC METABOLIC PANEL
Anion gap: 9 (ref 5–15)
BUN: 17 mg/dL (ref 8–23)
CO2: 27 mmol/L (ref 22–32)
Calcium: 9 mg/dL (ref 8.9–10.3)
Chloride: 100 mmol/L (ref 98–111)
Creatinine, Ser: 1.01 mg/dL — ABNORMAL HIGH (ref 0.44–1.00)
GFR, Estimated: 54 mL/min — ABNORMAL LOW (ref >60)
Glucose, Bld: 209 mg/dL — ABNORMAL HIGH (ref 70–99)
Potassium: 3.1 mmol/L — ABNORMAL LOW (ref 3.5–5.1)
Sodium: 136 mmol/L (ref 135–145)

## 2021-05-21 LAB — PROTIME-INR
INR: 1.2 (ref 0.8–1.2)
Prothrombin Time: 14.8 seconds (ref 11.4–15.2)

## 2021-05-21 LAB — TROPONIN I (HIGH SENSITIVITY)
Troponin I (High Sensitivity): 22 ng/L — ABNORMAL HIGH (ref <18)
Troponin I (High Sensitivity): 19 ng/L — ABNORMAL HIGH (ref <18)

## 2021-05-21 LAB — MAGNESIUM: Magnesium: 2.3 mg/dL (ref 1.7–2.4)

## 2021-05-21 MED ORDER — FUROSEMIDE 10 MG/ML IJ SOLN
40.0000 mg | Freq: Every day | INTRAMUSCULAR | Status: DC
  Administered 2021-05-22: 40 mg via INTRAVENOUS
  Filled 2021-05-21: qty 4

## 2021-05-21 MED ORDER — PANTOPRAZOLE SODIUM 20 MG PO TBEC: 20.0000 mg | DELAYED_RELEASE_TABLET | Freq: Every day | ORAL | Status: DC

## 2021-05-21 MED ORDER — PANTOPRAZOLE SODIUM 40 MG PO TBEC
40.0000 mg | DELAYED_RELEASE_TABLET | Freq: Every day | ORAL | Status: DC
  Administered 2021-05-21 – 2021-05-22 (×2): 40 mg via ORAL
  Filled 2021-05-21 (×2): qty 1

## 2021-05-21 MED ORDER — CITALOPRAM HYDROBROMIDE 20 MG PO TABS
20.0000 mg | ORAL_TABLET | Freq: Every day | ORAL | Status: DC
  Administered 2021-05-21 – 2021-05-22 (×2): 20 mg via ORAL
  Filled 2021-05-21 (×2): qty 1

## 2021-05-21 MED ORDER — BUPROPION HCL ER (SR) 150 MG PO TB12
150.0000 mg | ORAL_TABLET | Freq: Two times a day (BID) | ORAL | Status: DC
  Administered 2021-05-21 – 2021-05-22 (×2): 150 mg via ORAL
  Filled 2021-05-21 (×3): qty 1

## 2021-05-21 MED ORDER — HEPARIN (PORCINE) 25000 UT/250ML-% IV SOLN
850.0000 [IU]/h | INTRAVENOUS | Status: DC
  Administered 2021-05-21: 850 [IU]/h via INTRAVENOUS
  Filled 2021-05-21: qty 250

## 2021-05-21 MED ORDER — ADULT MULTIVITAMIN W/MINERALS CH
1.0000 | ORAL_TABLET | Freq: Every day | ORAL | Status: DC
  Administered 2021-05-21 – 2021-05-22 (×2): 1 via ORAL
  Filled 2021-05-21 (×2): qty 1

## 2021-05-21 MED ORDER — HEPARIN BOLUS VIA INFUSION
4000.0000 [IU] | Freq: Once | INTRAVENOUS | Status: DC
  Filled 2021-05-21: qty 4000

## 2021-05-21 MED ORDER — MEMANTINE HCL 10 MG PO TABS
10.0000 mg | ORAL_TABLET | Freq: Two times a day (BID) | ORAL | Status: DC
  Administered 2021-05-21 – 2021-05-22 (×2): 10 mg via ORAL
  Filled 2021-05-21 (×3): qty 1

## 2021-05-21 MED ORDER — CLOPIDOGREL BISULFATE 75 MG PO TABS
75.0000 mg | ORAL_TABLET | Freq: Every day | ORAL | Status: DC
Start: 2021-05-21 — End: 2021-05-22
  Administered 2021-05-21 – 2021-05-22 (×2): 75 mg via ORAL
  Filled 2021-05-21 (×2): qty 1

## 2021-05-21 MED ORDER — ENOXAPARIN SODIUM 40 MG/0.4ML IJ SOSY
40.0000 mg | PREFILLED_SYRINGE | INTRAMUSCULAR | Status: DC
  Administered 2021-05-21: 40 mg via SUBCUTANEOUS
  Filled 2021-05-21: qty 0.4

## 2021-05-21 MED ORDER — POTASSIUM CHLORIDE CRYS ER 20 MEQ PO TBCR
40.0000 meq | EXTENDED_RELEASE_TABLET | ORAL | Status: AC
  Administered 2021-05-21: 40 meq via ORAL
  Filled 2021-05-21: qty 2

## 2021-05-21 NOTE — Evaluation (Signed)
Physical Therapy Evaluation Patient Details Name: Pamela Wolfe MRN: [id number] DOB: December 21, 1933 Today's Date: 05/21/2021  History of Present Illness  88 year old F with PMH of dementia, CVA, HTN and depression presenting with shortness of breath, wheeze, edema and hypoxia to 80s, and admitted for acute CHF   Clinical Impression  Pt received supine in bed, daughter in room and agreeable to therapy. She appears pleasantly confused, oriented to self only and at times during conversation demonstrates dysphasia where she either cannot find a word or says a word that does not fit within context. Daughter assisted with home setup and PLOF. Pt lives with her daughter in 1 level home, has 3 STE and uses a standard [place] for mobility when out of the home. She holds onto furniture within the home - education provided on safety and encouraged use of AD within the home. She attends adult day care during the day, Monday-Friday.  Pt performed bed mobility with MOD A for trunk support, STS with SUP and ambulation with CGA for light steadying assist. RW used throughout. Pt was able to ambulate 332ft while maintaining O2 sats in the mid-low 90's on 3L O2 via [place]. SOB noted at end of walk. Difficulty clearing bilateral feet during gait however no LOB or tripping. MMT revealed 4+ to 5/5 strength throughout BLE. PT rec HHPT to address mild balance and gait deficits. Would benefit from skilled PT in the acute setting to address above deficits and promote optimal return to PLOF.      Recommendations for follow up therapy are one component of a multi-disciplinary discharge planning process, led by the attending physician.  Recommendations may be updated based on patient status, additional functional criteria and insurance authorization.  Follow Up Recommendations Home health PT    Assistance Recommended at Discharge Intermittent Supervision/Assistance  Patient can return home with the following  A little help with walking  and/or transfers;A little help with bathing/dressing/bathroom;Assistance with cooking/housework;Assist for transportation;Help with stairs or ramp for entrance    Equipment Recommendations None recommended by PT  Recommendations for Other Services       Functional Status Assessment Patient has had a recent decline in their functional status and demonstrates the ability to make significant improvements in function in a reasonable and predictable amount of time.     Precautions / Restrictions Precautions Precautions: Fall Restrictions Weight Bearing Restrictions: No      Mobility  Bed Mobility Overal bed mobility: Needs Assistance Bed Mobility: Supine to Sit     Supine to sit: Mod assist, HOB elevated     General bed mobility comments: To assist with lifting trunk to upright.    Transfers Overall transfer level: Needs assistance Equipment used: Rolling Tuff (2 wheels) Transfers: Sit to/from Stand Sit to Stand: Supervision           General transfer comment: From EOB (x2 reps) and recliner (x1 rep); SUP for safety    Ambulation/Gait Ambulation/Gait assistance: Min guard Gait Distance (Feet): 360 Feet Assistive device: Rolling Dicioccio (2 wheels) Gait Pattern/deviations: Step-through pattern, Shuffle, Trunk flexed Gait velocity: decreased     General Gait Details: Decreased step clearance bilaterally. CGA for safety. Performs visual scanning frequently; has difficulty with problem solving to navigate back to room once told her room number. Pt on 3L O2 with SpO2 maintained in mid-low 90's.  Stairs            Wheelchair Mobility    Modified Rankin (Stroke Patients Only)       Balance  Overall balance assessment: Needs assistance Sitting-balance support: No upper extremity supported, Feet supported Sitting balance-Leahy Scale: Fair Sitting balance - Comments: Posterior lean during LE MMT   Standing balance support: Bilateral upper extremity supported,  During functional activity Standing balance-Leahy Scale: Fair Standing balance comment: Static stand and ambulation using RW                             Pertinent Vitals/Pain Pain Assessment Pain Assessment: No/denies pain    Home Living Family/patient expects to be discharged to:: Private residence Living Arrangements: Children Available Help at Discharge: Family;Available PRN/intermittently Type of Home: House Home Access: Stairs to enter Entrance Stairs-Rails: Can reach both Entrance Stairs-Number of Steps: 3   Home Layout: One level Home Equipment: Rollator (4 wheels);Standard [place];Shower seat Additional Comments: Pt lives with daughter; goes to "day school" M-F from 9-3:30.    Prior Function Prior Level of Function : Independent/Modified Independent;History of Falls (last six months)             Mobility Comments: Independent within the home; refuses to use AD, holds onto furniture. Uses [employer] and at "day school". 2 falls in the last 6 months, 2 days in a row due to dizziness following bending over. Was able to stand up from floor with assist from daughter one of those times (other required EMS). ADLs Comments: Independent with ADLs including toileting and showering. Does not use shower chair. Assist with IADLs.     Hand Dominance        Extremity/Trunk Assessment   Upper Extremity Assessment Upper Extremity Assessment: Overall WFL for tasks assessed    Lower Extremity Assessment Lower Extremity Assessment: Overall WFL for tasks assessed (5/5 BLE)       Communication   Communication: Expressive difficulties (Dysphasia - daughter reports has gotten worse over the last 3-4 months.)  Cognition Arousal/Alertness: Awake/alert Behavior During Therapy: WFL for tasks assessed/performed Overall Cognitive Status: History of cognitive impairments - at baseline                                 General Comments: Oriented to self  only; does require increased time to recall DOB. States she is in the hospital, unsure of hospital name/location. Pleasantly confused. Agreeable throughout. Requires reminding and redirection.        General Comments      Exercises     Assessment/Plan    PT Assessment Patient needs continued PT services  PT Problem List Decreased mobility;Decreased safety awareness;Decreased activity tolerance;Decreased balance       PT Treatment Interventions Therapeutic activities;Gait training;Therapeutic exercise;Patient/family education;Stair training;Balance training;Functional mobility training    PT Goals (Current goals can be found in the Care Plan section)  Acute Rehab PT Goals Patient Stated Goal: to wean off O2 and go home PT Goal Formulation: With patient Time For Goal Achievement: 06/04/21 Potential to Achieve Goals: Good    Frequency Min 2X/week     Co-evaluation               AM-PAC PT "6 Clicks" Mobility  Outcome Measure Help needed turning from your back to your side while in a flat bed without using bedrails?: A Little Help needed moving from lying on your back to sitting on the side of a flat bed without using bedrails?: A Little Help needed moving to and from a bed to a chair (  including a wheelchair)?: A Little Help needed standing up from a chair using your arms (e.g., wheelchair or bedside chair)?: A Little Help needed to walk in hospital room?: A Little Help needed climbing 3-5 steps with a railing? : A Little 6 Click Score: 18    End of Session Equipment Utilized During Treatment: Gait belt;Oxygen Activity Tolerance: Patient tolerated treatment well Patient left: in chair;with call bell/phone within reach;with chair alarm set;with family/visitor present Nurse Communication: Mobility status PT Visit Diagnosis: Unsteadiness on feet (R26.81);Other abnormalities of gait and mobility (R26.89);Difficulty in walking, not elsewhere classified (R26.2)    Time:  1599-6895 PT Time Calculation (min) (ACUTE ONLY): 43 min   Charges:   PT Evaluation $PT Eval Moderate Complexity: 1 Mod PT Treatments $Gait Training: 8-22 mins $Therapeutic Activity: 8-22 mins        Patrina Levering PT, DPT 05/21/21 3:57 PM [phone]

## 2021-05-21 NOTE — Progress Notes (Signed)
*  PRELIMINARY RESULTS* Echocardiogram 2D Echocardiogram has been performed.  Pamela Wolfe 05/21/2021, 12:40 PM

## 2021-05-21 NOTE — Consult Note (Signed)
Pamela Wolfe is a 88 y.o. female  [id number]  Primary Cardiologist: Neoma Laming, MD Reason for Consultation: elevated troponin  HPI: Pamela Wolfe is a 88 y.o. female with medical history significant for HTN, depression, memory loss/dementia and prior history of stroke who was brought in and acute respiratory distress with EMS reporting O2 sats in the 80s on room air requiring NRB.  EMS found her to be wheezing and administered 2 albuterol treatments as well as IV Solu-Medrol and magnesium.  Patient reportedly had lower extremity edema but have been increasing over the past several days but denied leg pain.  Had no cough, fever or chills, denied nausea, vomiting, abdominal pain or diarrhea.   Review of Systems: denies chest pain, shortness of breath improved since being in the ED   Past Medical History:  Diagnosis Date   Depression    Essential hypertension 01/26/2017   GERD (gastroesophageal reflux disease)    History of stroke 12/08/2016   Loss of memory 12/08/2016   Macular degeneration    Neck pain 12/08/2016   Recurrent major depressive disorder, in full remission ([place]) 01/26/2017   Sleep apnea    Tremor 02/06/2017    (Not in a hospital admission)     aspirin EC  81 mg Oral Daily   enoxaparin (LOVENOX) injection  40 mg Subcutaneous Q24H   [START ON 05/22/2021] furosemide  40 mg Intravenous Daily   losartan  25 mg Oral Daily   potassium chloride  40 mEq Oral Q4H   sodium chloride flush  3 mL Intravenous Q12H    Infusions:  sodium chloride      No Known Allergies  Social History   Socioeconomic History   Marital status: Divorced    Spouse name: Not on file   Number of children: Not on file   Years of education: Not on file   Highest education level: Not on file  Occupational History   Not on file  Tobacco Use   Smoking status: Former   Smokeless tobacco: Never  Substance and Sexual Activity   Alcohol use: Yes   Drug use: No   Sexual activity: Not on file  Other  Topics Concern   Not on file  Social History Narrative   Not on file   Social Determinants of Health   Financial Resource Strain: Not on file  Food Insecurity: Not on file  Transportation Needs: Not on file  Physical Activity: Not on file  Stress: Not on file  Social Connections: Not on file  Intimate Partner Violence: Not on file    Family History  Problem Relation Age of Onset   Bladder Cancer Neg Hx    Kidney cancer Neg Hx    Prostate cancer Neg Hx     PHYSICAL EXAM: Vitals:   05/21/21 0500 05/21/21 0700  BP: (!) 117/53 132/74  Pulse: 87 85  Resp: 16 15  Temp:    SpO2: 95% 97%    No intake or output data in the 24 hours ending 05/21/21 0843  General:  Well appearing. No respiratory difficulty HEENT: normal Neck: supple. no JVD. Carotids 2+ bilat; no bruits. No lymphadenopathy or thryomegaly appreciated. Cor: PMI nondisplaced. Regular rate & rhythm. No rubs, gallops or murmurs. Lungs: clear Abdomen: soft, nontender, nondistended. No hepatosplenomegaly. No bruits or masses. Good bowel sounds. Extremities: no cyanosis, clubbing, rash, edema Neuro: alert & oriented x 3, cranial nerves grossly intact. moves all 4 extremities w/o difficulty. Affect pleasant.  ECG: sinus rhythm, HR 95 bpm,  RBBB  Results for orders placed or performed during the hospital encounter of 05/20/21 (from the past 24 hour(s))  Comprehensive metabolic panel     Status: Abnormal   Collection Time: 05/20/21  7:46 PM  Result Value Ref Range   Sodium 138 135 - 145 mmol/L   Potassium 3.1 (L) 3.5 - 5.1 mmol/L   Chloride 101 98 - 111 mmol/L   CO2 26 22 - 32 mmol/L   Glucose, Bld 128 (H) 70 - 99 mg/dL   BUN 15 8 - 23 mg/dL   Creatinine, Ser 0.74 0.44 - 1.00 mg/dL   Calcium 9.1 8.9 - 10.3 mg/dL   Total Protein 6.3 (L) 6.5 - 8.1 g/dL   Albumin 3.7 3.5 - 5.0 g/dL   AST 17 15 - 41 U/L   ALT 18 0 - 44 U/L   Alkaline Phosphatase 75 38 - 126 U/L   Total Bilirubin 1.1 0.3 - 1.2 mg/dL   GFR,  Estimated >60 >60 mL/min   Anion gap 11 5 - 15  Brain natriuretic peptide     Status: Abnormal   Collection Time: 05/20/21  7:46 PM  Result Value Ref Range   B Natriuretic Peptide 256.5 (H) 0.0 - 100.0 pg/mL  Troponin I (High Sensitivity)     Status: Abnormal   Collection Time: 05/20/21  7:46 PM  Result Value Ref Range   Troponin I (High Sensitivity) 34 (H) <18 ng/L  CBC with Differential     Status: Abnormal   Collection Time: 05/20/21  7:46 PM  Result Value Ref Range   WBC 10.6 (H) 4.0 - 10.5 K/uL   RBC 4.18 3.87 - 5.11 MIL/uL   Hemoglobin 13.1 12.0 - 15.0 g/dL   HCT 38.9 36.0 - 46.0 %   MCV 93.1 80.0 - 100.0 fL   MCH 31.3 26.0 - 34.0 pg   MCHC 33.7 30.0 - 36.0 g/dL   RDW 13.7 11.5 - 15.5 %   Platelets 278 150 - 400 K/uL   nRBC 0.0 0.0 - 0.2 %   Neutrophils Relative % 79 %   Neutro Abs 8.3 (H) 1.7 - 7.7 K/uL   Lymphocytes Relative 13 %   Lymphs Abs 1.4 0.7 - 4.0 K/uL   Monocytes Relative 7 %   Monocytes Absolute 0.8 0.1 - 1.0 K/uL   Eosinophils Relative 1 %   Eosinophils Absolute 0.1 0.0 - 0.5 K/uL   Basophils Relative 0 %   Basophils Absolute 0.0 0.0 - 0.1 K/uL   Immature Granulocytes 0 %   Abs Immature Granulocytes 0.04 0.00 - 0.07 K/uL  Resp Panel by RT-PCR (Flu A&B, Covid) Nasopharyngeal Swab     Status: None   Collection Time: 05/20/21  7:46 PM   Specimen: Nasopharyngeal Swab; Nasopharyngeal(NP) swabs in vial transport medium  Result Value Ref Range   SARS Coronavirus 2 by RT PCR NEGATIVE NEGATIVE   Influenza A by PCR NEGATIVE NEGATIVE   Influenza B by PCR NEGATIVE NEGATIVE  Procalcitonin - Baseline     Status: None   Collection Time: 05/20/21  7:46 PM  Result Value Ref Range   Procalcitonin <0.10 ng/mL  Troponin I (High Sensitivity)     Status: Abnormal   Collection Time: 05/20/21  9:43 PM  Result Value Ref Range   Troponin I (High Sensitivity) 61 (H) <18 ng/L  D-dimer, quantitative     Status: Abnormal   Collection Time: 05/20/21 10:13 PM  Result Value Ref  Range   D-Dimer, Quant 0.80 (H) 0.00 -  0.50 ug/mL-FEU  Basic metabolic panel     Status: Abnormal   Collection Time: 05/21/21  2:41 AM  Result Value Ref Range   Sodium 136 135 - 145 mmol/L   Potassium 3.1 (L) 3.5 - 5.1 mmol/L   Chloride 100 98 - 111 mmol/L   CO2 27 22 - 32 mmol/L   Glucose, Bld 209 (H) 70 - 99 mg/dL   BUN 17 8 - 23 mg/dL   Creatinine, Ser 1.01 (H) 0.44 - 1.00 mg/dL   Calcium 9.0 8.9 - 10.3 mg/dL   GFR, Estimated 54 (L) >60 mL/min   Anion gap 9 5 - 15  APTT     Status: None   Collection Time: 05/21/21  2:41 AM  Result Value Ref Range   aPTT 31 24 - 36 seconds  Protime-INR     Status: None   Collection Time: 05/21/21  2:41 AM  Result Value Ref Range   Prothrombin Time 14.8 11.4 - 15.2 seconds   INR 1.2 0.8 - 1.2   US Venous Img Lower Unilateral Left  Result Date: 05/20/2021 CLINICAL DATA:  Left leg pain and swelling for several months, initial encounter EXAM: LEFT LOWER EXTREMITY VENOUS DOPPLER ULTRASOUND TECHNIQUE: Gray-scale sonography with graded compression, as well as color Doppler and duplex ultrasound were performed to evaluate the lower extremity deep venous systems from the level of the common femoral vein and including the common femoral, femoral, profunda femoral, popliteal and calf veins including the posterior tibial, peroneal and gastrocnemius veins when visible. The superficial great saphenous vein was also interrogated. Spectral Doppler was utilized to evaluate flow at rest and with distal augmentation maneuvers in the common femoral, femoral and popliteal veins. COMPARISON:  None. FINDINGS: Contralateral Common Femoral Vein: Respiratory phasicity is normal and symmetric with the symptomatic side. No evidence of thrombus. Normal compressibility. Common Femoral Vein: No evidence of thrombus. Normal compressibility, respiratory phasicity and response to augmentation. Saphenofemoral Junction: No evidence of thrombus. Normal compressibility and flow on color  Doppler imaging. Profunda Femoral Vein: No evidence of thrombus. Normal compressibility and flow on color Doppler imaging. Femoral Vein: No evidence of thrombus. Normal compressibility, respiratory phasicity and response to augmentation. Popliteal Vein: No evidence of thrombus. Normal compressibility, respiratory phasicity and response to augmentation. Calf Veins: No evidence of thrombus. Normal compressibility and flow on color Doppler imaging. Superficial Great Saphenous Vein: No evidence of thrombus. Normal compressibility. Venous Reflux:  None. Other Findings:  None. IMPRESSION: No evidence of deep venous thrombosis. Electronically Signed   By: Inez Catalina M.D.   On: 05/20/2021 21:15   DG Chest Portable 1 View  Result Date: 05/20/2021 CLINICAL DATA:  Shortness of breath EXAM: PORTABLE CHEST 1 VIEW COMPARISON:  None. FINDINGS: Cardiac shadow is at the upper limits of normal in size. Aortic calcifications are noted. Vascular congestion is noted with interstitial and parenchymal edema. Mild basilar atelectasis is noted slightly worse on the right than the left. No bony abnormality is noted. IMPRESSION: Vascular congestion and edema consistent with CHF. Bibasilar atelectatic changes are noted. Electronically Signed   By: Inez Catalina M.D.   On: 05/20/2021 20:06     ASSESSMENT AND PLAN: Troponin levels elevated likely due to demand ischemia due to CHF vs. COPD. EKG showed no acute changes. Patient denies chest pain. Vitals stable. Echo to be completed. Continue Lasix. Will continue to follow.  Engineer, drilling FNP-C

## 2021-05-21 NOTE — Progress Notes (Signed)
OT Cancellation Note  Patient Details Name: Cassidey Barrales MRN: [id number] DOB: [birth year]   Cancelled Treatment:    Reason Eval/Treat Not Completed: Patient at procedure or test/ unavailable. Consult received, chart reviewed. Pt off the floor. Will re-attempt at later date/time as pt is available and medically appropriate.   Ardeth Perfect., MPH, MS, OTR/L ascom [phone] 05/21/21, 1:00 PM

## 2021-05-21 NOTE — Progress Notes (Signed)
PROGRESS NOTE  Pamela Wolfe NIO:[id number] DOB: [birth year]   PCP: Marinda Elk, MD  Patient is from: Home.  Lives with daughter.  Uses [facility] at baseline.  DOA: 05/20/2021 LOS: 1  Chief complaints:  Shortness of breath  Brief Narrative / Interim history: 88 year old F with PMH of dementia, CVA, HTN and depression presenting with shortness of breath, wheeze, edema and hypoxia to 80s, and admitted for acute CHF.  BNP elevated to 256.  CXR with with vascular congestion and edema consistent with CHF.  Started on IV Lasix.   Subjective: Seen and examined earlier this morning.  No major events overnight of this morning.  Patient's daughter at bedside.  She denies chest pain, shortness of breath but intermittent cough.  Denies orthopnea, PND or significant weight gain.  Objective: Vitals:   05/21/21 0800 05/21/21 0830 05/21/21 0930 05/21/21 1311  BP: 90/77 114/73 133/73 (!) 97/58  Pulse: 98 94 85 80  Resp: (!) 21 (!) 22 18   Temp:    (!) 97.5 F (36.4 C)  TempSrc:    Oral  SpO2: (!) 82% 93% 94% 94%  Weight:      Height:        Examination:  GENERAL: No apparent distress.  Nontoxic. HEENT: MMM.  Vision grossly intact.  Diminished hearing. NECK: Supple.  No apparent JVD but sitting upright.Marland Kitchen  RESP: 94% on 3 L.  No IWOB.  Bibasilar crackles. CVS:  RRR. Heart sounds normal.  ABD/GI/GU: BS+. Abd soft, NTND.  MSK/EXT:  Moves extremities. No apparent deformity.  Trace BLE edema. SKIN: no apparent skin lesion or wound NEURO: Awake, alert and oriented fairly.  No apparent focal neuro deficit. PSYCH: Calm. Normal affect.   Procedures:  None  Microbiology summarized: GHWEX-93 and influenza PCR nonreactive.  Assessment & Plan: Acute respiratory failure with hypoxia: Likely due to acute CHF.  No prior history of CHF.   Reports improvement in her breathing.  Bilateral crackles and trace edema on exam. Started on IV Lasix.  I&O incomplete.  Creatinine slightly up. -Wean oxygen as  able, incentive spirometry -Follow TTE -Holding diuretics today -Monitor fluid status, renal functions and electrolytes -Cardiology following.  Elevated troponin: Likely demand ischemia.  Patient without chest pain.  EKG without acute ischemic finding. -Already on Plavix and aspirin. -Follow TTE  Elevated creatinine: Likely from diuretics. Recent Labs    05/20/21 1946 05/21/21 0241  BUN 15 17  CREATININE 0.74 1.01*  -Hold diuretics  Dementia without behavioral disturbance -Reorientation delirium precautions.  Elevated D-dimer: Normal for age.  LLE [place] negative for DVT. -Discontinue heparin  Mood disorder: -Continue home meds.  History of CVA -Continue Plavix and aspirin.  Hypokalemia: Likely from diuretics.  Mg within normal. -P.o. KCl 40x2  Physical deconditioning/debility -PT/OT  Class II obesity Body mass index is 39.23 kg/m.         DVT prophylaxis:  enoxaparin (LOVENOX) injection 40 mg Start: 05/21/21 2200  Code Status: DNR/DNI Family Communication: Updated patient's sister at bedside. Level of care: Progressive Status is: Inpatient  Remains inpatient appropriate because: Further evaluation for acute CHF       Consultants:  Cardiology   Sch Meds:  Scheduled Meds:  aspirin EC  81 mg Oral Daily   buPROPion  150 mg Oral BID   citalopram  20 mg Oral Daily   clopidogrel  75 mg Oral Daily   enoxaparin (LOVENOX) injection  40 mg Subcutaneous Q24H   [START ON 05/22/2021] furosemide  40 mg Intravenous Daily  losartan  25 mg Oral Daily   memantine  10 mg Oral BID   Multivitamin Women  1 tablet Oral Daily   pantoprazole  20 mg Oral Daily   potassium chloride  40 mEq Oral Q4H   sodium chloride flush  3 mL Intravenous Q12H   Continuous Infusions:  sodium chloride     PRN Meds:.sodium chloride, acetaminophen, ipratropium-albuterol, ondansetron (ZOFRAN) IV, sodium chloride flush  Antimicrobials: Anti-infectives (From admission, onward)     None        I have personally reviewed the following labs and images: CBC: Recent Labs  Lab 05/20/21 1946  WBC 10.6*  NEUTROABS 8.3*  HGB 13.1  HCT 38.9  MCV 93.1  PLT 278   BMP &GFR Recent Labs  Lab 05/20/21 1946 05/21/21 0241 05/21/21 0902  NA 138 136  --   K 3.1* 3.1*  --   CL 101 100  --   CO2 26 27  --   GLUCOSE 128* 209*  --   BUN 15 17  --   CREATININE 0.74 1.01*  --   CALCIUM 9.1 9.0  --   MG  --   --  2.3   Estimated Creatinine Clearance: 43.6 mL/min (A) (by C-G formula based on SCr of 1.01 mg/dL (H)). Liver & Pancreas: Recent Labs  Lab 05/20/21 1946  AST 17  ALT 18  ALKPHOS 75  BILITOT 1.1  PROT 6.3*  ALBUMIN 3.7   No results for input(s): LIPASE, AMYLASE in the last 168 hours. No results for input(s): AMMONIA in the last 168 hours. Diabetic: No results for input(s): HGBA1C in the last 72 hours. No results for input(s): GLUCAP in the last 168 hours. Cardiac Enzymes: No results for input(s): CKTOTAL, CKMB, CKMBINDEX, TROPONINI in the last 168 hours. No results for input(s): PROBNP in the last 8760 hours. Coagulation Profile: Recent Labs  Lab 05/21/21 0241  INR 1.2   Thyroid Function Tests: No results for input(s): TSH, T4TOTAL, FREET4, T3FREE, THYROIDAB in the last 72 hours. Lipid Profile: No results for input(s): CHOL, HDL, LDLCALC, TRIG, CHOLHDL, LDLDIRECT in the last 72 hours. Anemia Panel: No results for input(s): VITAMINB12, FOLATE, FERRITIN, TIBC, IRON, RETICCTPCT in the last 72 hours. Urine analysis:    Component Value Date/Time   APPEARANCEUR Clear 12/09/2020 1546   GLUCOSEU Negative 12/09/2020 1546   BILIRUBINUR Negative 12/09/2020 1546   PROTEINUR Negative 12/09/2020 1546   NITRITE Positive (A) 12/09/2020 1546   LEUKOCYTESUR Negative 12/09/2020 1546   Sepsis Labs: Invalid input(s): PROCALCITONIN, [place]  Microbiology: Recent Results (from the past 240 hour(s))  Resp Panel by RT-PCR (Flu A&B, Covid) Nasopharyngeal  Swab     Status: None   Collection Time: 05/20/21  7:46 PM   Specimen: Nasopharyngeal Swab; Nasopharyngeal(NP) swabs in vial transport medium  Result Value Ref Range Status   SARS Coronavirus 2 by RT PCR NEGATIVE NEGATIVE Final    Comment: (NOTE) SARS-CoV-2 target nucleic acids are NOT DETECTED.  The SARS-CoV-2 RNA is generally detectable in upper respiratory specimens during the acute phase of infection. The lowest concentration of SARS-CoV-2 viral copies this assay can detect is 138 copies/mL. A negative result does not preclude SARS-Cov-2 infection and should not be used as the sole basis for treatment or other patient management decisions. A negative result may occur with  improper specimen collection/handling, submission of specimen other than nasopharyngeal swab, presence of viral mutation(s) within the areas targeted by this assay, and inadequate number of viral copies(<138 copies/mL). A negative  result must be combined with clinical observations, patient history, and epidemiological information. The expected result is Negative.  Fact Sheet for Patients:  EntrepreneurPulse.com.au  Fact Sheet for Healthcare Providers:  IncredibleEmployment.be  This test is no t yet approved or cleared by the [place] FDA and  has been authorized for detection and/or diagnosis of SARS-CoV-2 by FDA under an Emergency Use Authorization (EUA). This EUA will remain  in effect (meaning this test can be used) for the duration of the COVID-19 declaration under Section 564(b)(1) of the Act, 21 U.S.C.section 360bbb-3(b)(1), unless the authorization is terminated  or revoked sooner.       Influenza A by PCR NEGATIVE NEGATIVE Final   Influenza B by PCR NEGATIVE NEGATIVE Final    Comment: (NOTE) The Xpert Xpress SARS-CoV-2/FLU/RSV plus assay is intended as an aid in the diagnosis of influenza from Nasopharyngeal swab specimens and should not be used as a sole  basis for treatment. Nasal washings and aspirates are unacceptable for Xpert Xpress SARS-CoV-2/FLU/RSV testing.  Fact Sheet for Patients: EntrepreneurPulse.com.au  Fact Sheet for Healthcare Providers: IncredibleEmployment.be  This test is not yet approved or cleared by the [place] FDA and has been authorized for detection and/or diagnosis of SARS-CoV-2 by FDA under an Emergency Use Authorization (EUA). This EUA will remain in effect (meaning this test can be used) for the duration of the COVID-19 declaration under Section 564(b)(1) of the Act, 21 U.S.C. section 360bbb-3(b)(1), unless the authorization is terminated or revoked.  Performed at [facility], [address] [place], [place] [address]     Radiology Studies: US Venous Img Lower Unilateral Left  Result Date: 05/20/2021 CLINICAL DATA:  Left leg pain and swelling for several months, initial encounter EXAM: LEFT LOWER EXTREMITY VENOUS DOPPLER ULTRASOUND TECHNIQUE: Gray-scale sonography with graded compression, as well as color Doppler and duplex ultrasound were performed to evaluate the lower extremity deep venous systems from the level of the common femoral vein and including the common femoral, femoral, profunda femoral, popliteal and calf veins including the posterior tibial, peroneal and gastrocnemius veins when visible. The superficial great saphenous vein was also interrogated. Spectral Doppler was utilized to evaluate flow at rest and with distal augmentation maneuvers in the common femoral, femoral and popliteal veins. COMPARISON:  None. FINDINGS: Contralateral Common Femoral Vein: Respiratory phasicity is normal and symmetric with the symptomatic side. No evidence of thrombus. Normal compressibility. Common Femoral Vein: No evidence of thrombus. Normal compressibility, respiratory phasicity and response to augmentation. Saphenofemoral Junction: No evidence of thrombus.  Normal compressibility and flow on color Doppler imaging. Profunda Femoral Vein: No evidence of thrombus. Normal compressibility and flow on color Doppler imaging. Femoral Vein: No evidence of thrombus. Normal compressibility, respiratory phasicity and response to augmentation. Popliteal Vein: No evidence of thrombus. Normal compressibility, respiratory phasicity and response to augmentation. Calf Veins: No evidence of thrombus. Normal compressibility and flow on color Doppler imaging. Superficial Great Saphenous Vein: No evidence of thrombus. Normal compressibility. Venous Reflux:  None. Other Findings:  None. IMPRESSION: No evidence of deep venous thrombosis. Electronically Signed   By: Inez Catalina M.D.   On: 05/20/2021 21:15   DG Chest Portable 1 View  Result Date: 05/20/2021 CLINICAL DATA:  Shortness of breath EXAM: PORTABLE CHEST 1 VIEW COMPARISON:  None. FINDINGS: Cardiac shadow is at the upper limits of normal in size. Aortic calcifications are noted. Vascular congestion is noted with interstitial and parenchymal edema. Mild basilar atelectasis is noted slightly worse on the right than the  left. No bony abnormality is noted. IMPRESSION: Vascular congestion and edema consistent with CHF. Bibasilar atelectatic changes are noted. Electronically Signed   By: Inez Catalina M.D.   On: 05/20/2021 20:06        Pranay Hilbun T. Fajardo  If 7PM-7AM, please contact night-coverage [email or web address] 05/21/2021, 2:25 PM

## 2021-05-21 NOTE — Evaluation (Signed)
Occupational Therapy Evaluation Patient Details Name: Pamela Wolfe MRN: [id number] DOB: 16-Aug-1933 Today's Date: 05/21/2021   History of Present Illness 88 year old F with PMH of dementia, CVA, HTN and depression presenting with shortness of breath, wheeze, edema and hypoxia to 80s, and admitted for acute CHF   Clinical Impression   Pt was seen for OT evaluation this date. Prior to hospital admission, pt was living with her daughter and generally independent with basic ADL, requiring assist from daughter for IADL including med mgt, transportation, cleaning, and meals. Pt attends a day program M-F. Pt received in recliner, pleasant, no family present, and oriented to self. Pt able to follow commands with cues. Requires supv-CGA for LB ADL, MIN A for bed mobility, and ADL transfers. Currently pt demonstrates impairments as described below (See OT problem list) which functionally limit her ability to perform ADL/self-care tasks. Pt would benefit from skilled OT services to address noted impairments and functional limitations (see below for any additional details) in order to maximize safety and independence while minimizing falls risk and caregiver burden. Upon hospital discharge, recommend HHOT to maximize pt safety and return to functional independence during meaningful occupations of daily life.     Recommendations for follow up therapy are one component of a multi-disciplinary discharge planning process, led by the attending physician.  Recommendations may be updated based on patient status, additional functional criteria and insurance authorization.   Follow Up Recommendations  Home health OT    Assistance Recommended at Discharge Intermittent Supervision/Assistance  Patient can return home with the following A little help with walking and/or transfers;A little help with bathing/dressing/bathroom;Assistance with cooking/housework;Direct supervision/assist for medications management;Direct  supervision/assist for financial management;Assist for transportation;Help with stairs or ramp for entrance    Functional Status Assessment  Patient has had a recent decline in their functional status and demonstrates the ability to make significant improvements in function in a reasonable and predictable amount of time.  Equipment Recommendations  None recommended by OT    Recommendations for Other Services       Precautions / Restrictions Precautions Precautions: Fall Restrictions Weight Bearing Restrictions: No      Mobility Bed Mobility   Bed Mobility: Sit to Supine       Sit to supine: Min assist   General bed mobility comments: MIN A BLE back to bed    Transfers Overall transfer level: Needs assistance Equipment used: None Transfers: Sit to/from Stand Sit to Stand: Min guard                  Balance Overall balance assessment: Needs assistance Sitting-balance support: No upper extremity supported, Feet supported Sitting balance-Leahy Scale: Fair     Standing balance support: No upper extremity supported Standing balance-Leahy Scale: Fair                             ADL either performed or assessed with clinical judgement   ADL Overall ADL's : Needs assistance/impaired                                       General ADL Comments: Pt able to doff/don socks with increased effort/time while seated EOB, supv-CGA for ADL transfers     Vision         Perception     Praxis      Pertinent Vitals/Pain Pain Assessment  Pain Assessment: No/denies pain     Hand Dominance Right   Extremity/Trunk Assessment Upper Extremity Assessment Upper Extremity Assessment: Overall WFL for tasks assessed   Lower Extremity Assessment Lower Extremity Assessment: Overall WFL for tasks assessed       Communication Communication Communication: Expressive difficulties (Dysphasia - daughter reports has gotten worse over the last 3-4  months.)   Cognition Arousal/Alertness: Awake/alert Behavior During Therapy: WFL for tasks assessed/performed Overall Cognitive Status: History of cognitive impairments - at baseline                                 General Comments: oriented to self, pleasant, requires VC for sequencing     General Comments       Exercises     Shoulder Instructions      Home Living Family/patient expects to be discharged to:: Private residence Living Arrangements: Children Available Help at Discharge: Family;Available PRN/intermittently Type of Home: House Home Access: Stairs to enter [employer] of Steps: 3 Entrance Stairs-Rails: Can reach both Home Layout: One level     Bathroom Shower/Tub: Occupational psychologist: Standard Bathroom Accessibility: No   Home Equipment: Rollator (4 wheels);Standard Dredge;Shower seat   Additional Comments: Pt lives with daughter; goes to "day school" M-F from 9-3:30.      Prior Functioning/Environment Prior Level of Function : Independent/Modified Independent;History of Falls (last six months)             Mobility Comments: Independent within the home; refuses to use AD, holds onto furniture. Uses [employer] in community and at "day school". 2 falls in the last 6 months, 2 days in a row due to dizziness following bending over. Was able to stand up from floor with assist from daughter one of those times (other required EMS). ADLs Comments: Independent with ADLs including dressing, toileting, and showering. Does not use shower chair. Assist with IADLs.        OT Problem List: Decreased cognition;Cardiopulmonary status limiting activity;Decreased activity tolerance;Decreased safety awareness;Impaired balance (sitting and/or standing);Decreased knowledge of use of DME or AE      OT Treatment/Interventions: Self-care/ADL training;Therapeutic exercise;Therapeutic activities;DME and/or AE instruction;Patient/family  education;Balance training    OT Goals(Current goals can be found in the care plan section) Acute Rehab OT Goals Patient Stated Goal: go home OT Goal Formulation: With patient Time For Goal Achievement: 06/04/21 Potential to Achieve Goals: Good ADL Goals Pt Will Transfer to Toilet: ambulating;with supervision (remote supervision, AD PRN) Additional ADL Goal #1: Pt will perform UB/LB dressing with remote supervision, no LOB, 3/3 opportunities. Additional ADL Goal #2: Pt will perform daily morning ADL routine with supervision and SpO2 >92% on room air, 3/3 opportunities.  OT Frequency: Min 2X/week    Co-evaluation              AM-PAC OT "6 Clicks" Daily Activity     Outcome Measure Help from another person eating meals?: None Help from another person taking care of personal grooming?: A Little Help from another person toileting, which includes using toliet, bedpan, or urinal?: A Little Help from another person bathing (including washing, rinsing, drying)?: A Little Help from another person to put on and taking off regular upper body clothing?: A Little Help from another person to put on and taking off regular lower body clothing?: A Little 6 Click Score: 19   End of Session Equipment Utilized During Treatment: Oxygen  Activity Tolerance:  Patient tolerated treatment well Patient left: in bed;with call bell/phone within reach;with bed alarm set  OT Visit Diagnosis: Other abnormalities of gait and mobility (R26.89)                Time: 8937-3428 OT Time Calculation (min): 16 min Charges:  OT General Charges $OT Visit: 1 Visit OT Evaluation $OT Eval Moderate Complexity: 1 Mod  Ardeth Perfect., MPH, MS, OTR/L ascom [phone] 05/21/21, 4:31 PM

## 2021-05-21 NOTE — Progress Notes (Signed)
ANTICOAGULATION CONSULT NOTE - Initial Consult  Pharmacy Consult for Heparin  Indication: ACS/NSTEMI  No Known Allergies  Patient Measurements: Height: 5\' 2"  (157.5 cm) Weight: 97.3 kg (214 lb 8.1 oz) IBW/kg (Calculated) : 50.1 Heparin Dosing Weight: 73 kg   Vital Signs: Temp: 97.9 F (36.6 C) (01/19 1940) BP: 124/92 (01/20 0200) Pulse Rate: 89 (01/20 0200)  Labs: Recent Labs    05/20/21 1946 05/20/21 2143  HGB 13.1  --   HCT 38.9  --   PLT 278  --   CREATININE 0.74  --   TROPONINIHS 34* 61*    Estimated Creatinine Clearance: 55 mL/min (by C-G formula based on SCr of 0.74 mg/dL).   Medical History: Past Medical History:  Diagnosis Date   Depression    Essential hypertension 01/26/2017   GERD (gastroesophageal reflux disease)    History of stroke 12/08/2016   Loss of memory 12/08/2016   Macular degeneration    Neck pain 12/08/2016   Recurrent major depressive disorder, in full remission ([place]) 01/26/2017   Sleep apnea    Tremor 02/06/2017    Medications:  (Not in a hospital admission)   Assessment: Pharmacy consulted to dose heparin in this 88 year old female admitted with ACS/NSTEMI.  Pt was not on anticoag PTA but did receive lovenox 47.5 mg SQ X 1 on 1/19 @ 2311. CrCl = 55 ml/min  Goal of Therapy:  Heparin level 0.3-0.7 units/ml Monitor platelets by anticoagulation protocol: Yes   Plan:  Pt received lovenox 47.5 mg SQ on 1/19 @ 2311.  Will not bolus heparin. Will start heparin 850 units/hr.  Will check HL 8 hrs after start of drip.   Ayman Brull D 05/21/2021,2:21 AM

## 2021-05-22 DIAGNOSIS — I5031 Acute diastolic (congestive) heart failure: Secondary | ICD-10-CM

## 2021-05-22 DIAGNOSIS — F419 Anxiety disorder, unspecified: Secondary | ICD-10-CM | POA: Diagnosis not present

## 2021-05-22 DIAGNOSIS — J9601 Acute respiratory failure with hypoxia: Secondary | ICD-10-CM | POA: Diagnosis not present

## 2021-05-22 DIAGNOSIS — Z8673 Personal history of transient ischemic attack (TIA), and cerebral infarction without residual deficits: Secondary | ICD-10-CM

## 2021-05-22 DIAGNOSIS — R413 Other amnesia: Secondary | ICD-10-CM

## 2021-05-22 DIAGNOSIS — I10 Essential (primary) hypertension: Secondary | ICD-10-CM | POA: Diagnosis not present

## 2021-05-22 LAB — CBC
HCT: 34.8 % — ABNORMAL LOW (ref 36.0–46.0)
Hemoglobin: 11.5 g/dL — ABNORMAL LOW (ref 12.0–15.0)
MCH: 31.1 pg (ref 26.0–34.0)
MCHC: 33 g/dL (ref 30.0–36.0)
MCV: 94.1 fL (ref 80.0–100.0)
Platelets: 268 10*3/uL (ref 150–400)
RBC: 3.7 MIL/uL — ABNORMAL LOW (ref 3.87–5.11)
RDW: 13.7 % (ref 11.5–15.5)
WBC: 9.2 10*3/uL (ref 4.0–10.5)
nRBC: 0 % (ref 0.0–0.2)

## 2021-05-22 LAB — RENAL FUNCTION PANEL
Albumin: 3.4 g/dL — ABNORMAL LOW (ref 3.5–5.0)
Anion gap: 10 (ref 5–15)
BUN: 22 mg/dL (ref 8–23)
CO2: 30 mmol/L (ref 22–32)
Calcium: 8.9 mg/dL (ref 8.9–10.3)
Chloride: 98 mmol/L (ref 98–111)
Creatinine, Ser: 0.75 mg/dL (ref 0.44–1.00)
GFR, Estimated: >60 mL/min (ref >60)
Glucose, Bld: 113 mg/dL — ABNORMAL HIGH (ref 70–99)
Phosphorus: 3.1 mg/dL (ref 2.5–4.6)
Potassium: 3.7 mmol/L (ref 3.5–5.1)
Sodium: 138 mmol/L (ref 135–145)

## 2021-05-22 LAB — MAGNESIUM: Magnesium: 2.4 mg/dL (ref 1.7–2.4)

## 2021-05-22 MED ORDER — POTASSIUM CHLORIDE CRYS ER 20 MEQ PO TBCR
40.0000 meq | EXTENDED_RELEASE_TABLET | Freq: Once | ORAL | Status: AC
Start: 2021-05-22 — End: 2021-05-22
  Administered 2021-05-22: 40 meq via ORAL
  Filled 2021-05-22: qty 2

## 2021-05-22 MED ORDER — FUROSEMIDE 40 MG PO TABS: 40.0000 mg | ORAL_TABLET | Freq: Every day | ORAL | 0 refills | Status: DC

## 2021-05-22 MED ORDER — LOSARTAN POTASSIUM 50 MG PO TABS: 50.0000 mg | ORAL_TABLET | Freq: Every day | ORAL | 1 refills | Status: DC

## 2021-05-22 MED ORDER — ALBUTEROL SULFATE HFA 108 (90 BASE) MCG/ACT IN AERS: 2.0000 | INHALATION_SPRAY | Freq: Four times a day (QID) | RESPIRATORY_TRACT | 2 refills | Status: DC | PRN

## 2021-05-22 NOTE — TOC Initial Note (Signed)
Transition of Care [facility]) - Initial/Assessment Note    Patient Details  Name: Pamela Wolfe MRN: [id number] Date of Birth: 19-Jan-1934  Transition of Care [facility]) CM/SW Contact:    Magnus Ivan, LCSW Phone Number: 05/22/2021, 10:39 AM  Clinical Narrative:                Spoke with patient's granddaughter Mayford Knife via phone. Patient lives with her daughter who provides transportation. PCP is Dr. Carrie Mew. Pharmacy is [facility]. Patient goes to [facility]. Patient uses a RW in the community, doesn't use it at home per Faith.  Patient lives at [address], [place], [place]. Address in chart is billing address. Faith is agreeable to [facility] and prefers to use [facility] (now [place]). Referral accepted by Meg with [facility]. She is aware of DC orders in for today.    Expected Discharge Plan: [place] Barriers to Discharge: Barriers Resolved   Patient Goals and CMS Choice Patient states their goals for this hospitalization and ongoing recovery are:: home with home health CMS Medicare.gov Compare Post Acute Care list provided to:: Patient Represenative (must comment) Choice offered to / list presented to : Adult Children  Expected Discharge Plan and Services Expected Discharge Plan: [place]       Living arrangements for the past 2 months: Single Family Home Expected Discharge Date: 05/22/21                         HH Arranged: PT HH Agency: [facility] Date [place]: 05/22/21   Representative spoke with at [place]: [place] Arrangements/Services Living arrangements for the past 2 months: [place] Lives with:: Adult Children Patient language and need for interpreter reviewed:: Yes Do you feel safe going back to the place where you live?: Yes      Need for Family Participation in Patient Care: Yes (Comment) Care giver support system in place?: Yes (comment) Current home  services: DME Criminal Activity/Legal Involvement Pertinent to Current Situation/Hospitalization: No - Comment as needed  Activities of Daily Living Home Assistive Devices/Equipment: Hearing aid, Keeler (specify type) ADL Screening (condition at time of admission) Patient's cognitive ability adequate to safely complete daily activities?: Yes Is the patient deaf or have difficulty hearing?: Yes Does the patient have difficulty seeing, even when wearing glasses/contacts?: No Does the patient have difficulty concentrating, remembering, or making decisions?: No Patient able to express need for assistance with ADLs?: Yes Does the patient have difficulty dressing or bathing?: Yes Independently performs ADLs?: Yes (appropriate for developmental age) Does the patient have difficulty walking or climbing stairs?: No Weakness of Legs: None Weakness of Arms/Hands: None  Permission Sought/Granted Permission sought to share information with : Chartered certified accountant granted to share information with : Yes, Hospital doctor (by granddaughter Mayford Knife)     Permission granted to share info w AGENCY: [place] agencies        Emotional Assessment         Alcohol / Substance Use: Not Applicable Psych Involvement: No (comment)  Admission diagnosis:  Acute heart failure ([place]) [I50.9] Acute pulmonary edema (HCC) [J81.0] Acute respiratory failure with hypoxia ([place]) [J96.01] Patient Active Problem List   Diagnosis Date Noted   Acute heart failure ([place]) 05/20/2021   Depression    Flash pulmonary edema ([place])    Acute respiratory failure with hypoxia (HCC)    Tremor  02/06/2017   Essential hypertension 01/26/2017   Recurrent major depressive disorder, in full remission ([place]) 01/26/2017   History of stroke 12/08/2016   Memory loss 12/08/2016   Neck pain 12/08/2016   PCP:  Marinda Elk, MD Pharmacy:   [facility] [address] [place] [address] Phone: [phone] Fax: [phone]  [id number] [place], [place] [address] [place] [address] Phone: [phone] Fax: [phone]     Social Determinants of Health (SDOH) Interventions    Readmission Risk Interventions Readmission Risk Prevention Plan 05/22/2021  Post Dischage Appt Complete  Medication Screening Complete  Transportation Screening Complete  Some recent data might be hidden

## 2021-05-22 NOTE — Discharge Summary (Signed)
Physician Discharge Summary  Pamela Wolfe IHK:[id number] DOB: [birth year] DOA: 05/20/2021  PCP: Marinda Elk, MD  Admit date: 05/20/2021 Discharge date: 05/22/2021 Admitted From: Home Disposition: Home Recommendations for Outpatient Follow-up:  Follow ups as below. Please obtain CBC/BMP/Mag at follow up Please follow up on the following pending results: None Home Health: PT/OT Equipment/Devices: None Discharge Condition: Stable CODE STATUS: DNR/DNI  Follow-up Information     Dionisio David, MD Follow up on 05/24/2021.   Specialty: Cardiology Why: at 10:30 AM Contact information: [place] [address] [phone]                Hospital Course: 88 year old F with PMH of dementia, CVA, HTN and depression presenting with shortness of breath, wheeze, edema and hypoxia to 80s, and admitted for acute CHF.  BNP elevated to 256.  CXR with with vascular congestion and edema consistent with CHF.  Started on IV Lasix, and cardiology consulted.  TTE with LVEF of 60 to 65%, G3 DD and normal RVSP.  Symptoms improved with diuretics.  Cleared for discharge by cardiology on p.o. Lasix 40 mg daily.  Outpatient follow-up as above.   See individual problem list below for more on hospital course.  Discharge Diagnoses:  Acute respiratory failure with hypoxia: Likely due to acute CHF.  Resolved. -Manage CHF as below -Albuterol as needed for possible underlying obstructive etiology  Acute diastolic CHF: TTE with LVEF of 60 to 65% and G3 DD.  Chest x-ray consistent with CHF.  BNP elevated.  Diuresed with IV Lasix with improvement in her symptoms.  -Discharged on p.o. Lasix 40 mg daily -Discontinued home HCTZ and decrease losartan from 100 mg to 50 mg -Outpatient follow-up with cardiology as above -Counseled on sodium and fluid restriction as well as daily weight log.   Elevated troponin: Likely demand ischemia.  Patient without chest pain.  EKG without acute ischemic  finding.  TTE as above. -Already on Plavix and aspirin.  Elevated creatinine: Likely from diuretics.  Likely from diuretics.  Improved.     Recent Labs    05/20/21 1946 05/21/21 0241  BUN 15 17  CREATININE 0.74 1.01*  -Hold diuretics  Dementia without behavioral disturbance -Reorientation delirium precautions.   Elevated D-dimer: Normal for age.  LLE [place] negative for DVT.   Mood disorder: -Continue home meds.   History of CVA -Continue Plavix and aspirin.   Hypokalemia: Resolved. -Recheck at follow-up   Physical deconditioning/debility -HH PT/OT   Class II obesity Body mass index is 38.91 kg/m.  -Encourage lifestyle change to lose weight.         Discharge Exam: Vitals:   05/22/21 0458 05/22/21 0736 05/22/21 1053 05/22/21 1123  BP: (!) 141/77 (!) 141/70  132/60  Pulse: 80 87  77  Temp: 97.8 F (36.6 C) 98.4 F (36.9 C)  97.9 F (36.6 C)  Resp: 20 18  18   Height:      Weight:      SpO2: 95% 93% 94% 93%  TempSrc:  Oral    BMI (Calculated):         GENERAL: No apparent distress.  Nontoxic. HEENT: MMM.  Vision and hearing grossly intact.  NECK: Supple.  No apparent JVD.  RESP: 93% on RA.  No IWOB.  Fair aeration bilaterally. CVS:  RRR. Heart sounds normal.  ABD/GI/GU: Bowel sounds present. Soft. Non tender.  MSK/EXT:  Moves extremities. No apparent deformity. No edema.  SKIN: no apparent skin lesion or wound NEURO: Awake  and alert.  Oriented fairly.  No apparent focal neuro deficit. PSYCH: Calm. Normal affect.   Discharge Instructions  Discharge Instructions     (HEART FAILURE PATIENTS) Call MD:  Anytime you have any of the following symptoms: 1) 3 pound weight gain in 24 hours or 5 pounds in 1 week 2) shortness of breath, with or without a dry hacking cough 3) swelling in the hands, feet or stomach 4) if you have to sleep on extra pillows at night in order to breathe.   Complete by: As directed    Call MD for:  difficulty breathing, headache or  visual disturbances   Complete by: As directed    Call MD for:  extreme fatigue   Complete by: As directed    Call MD for:  persistant dizziness or light-headedness   Complete by: As directed    Diet - low sodium heart healthy   Complete by: As directed    Discharge instructions   Complete by: As directed    It has been a pleasure taking care of you!  You were hospitalized with shortness of breath and cough likely from heart failure.  We have started you on fluid medication (furosemide), and made other changes to your home blood pressure medications.  Please take your medications as prescribed.  Review your new medication list and the directions on your medications before you take them.  Follow-up with your cardiologist on Monday, 05/24/2021.   In addition to taking your medications as prescribed, we also recommend you avoid alcohol or over-the-counter pain medication other than plain Tylenol, limit the amount of water/fluid you drink to less than 6 cups (1500 cc) a day,  limit your sodium (salt) intake to less than 2 g (2000 mg) a day and weigh yourself daily at the same time and keeping your weight log.     Take care,   Increase activity slowly   Complete by: As directed       Allergies as of 05/22/2021   No Known Allergies      Medication List     STOP taking these medications    amLODipine 5 MG tablet Commonly known as: NORVASC   losartan-hydrochlorothiazide 100-25 MG tablet Commonly known as: HYZAAR   meloxicam 7.5 MG tablet Commonly known as: MOBIC       TAKE these medications    albuterol 108 (90 Base) MCG/ACT inhaler Commonly known as: VENTOLIN HFA Inhale 2 puffs into the lungs every 6 (six) hours as needed for wheezing or shortness of breath.   aspirin EC 81 MG tablet Take by mouth. Take 1 tablet 3 times a week on Monday, Wednesday and Friday.   b complex vitamins capsule Take 1 capsule by mouth daily.   buPROPion 150 MG 12 hr tablet Commonly known as:  WELLBUTRIN SR Take 1 tablet by mouth 2 (two) times daily.   citalopram 20 MG tablet Commonly known as: CELEXA Take 20 mg by mouth daily.   clopidogrel 75 MG tablet Commonly known as: PLAVIX Take 1 tablet by mouth daily.   CRANBERRY PO Take 252 mg by mouth every evening.   furosemide 40 MG tablet Commonly known as: Lasix Take 1 tablet (40 mg total) by mouth daily. Start taking on: May 23, 2021   losartan 50 MG tablet Commonly known as: COZAAR Take 1 tablet (50 mg total) by mouth daily. Start taking on: May 23, 2021   memantine 10 MG tablet Commonly known as: NAMENDA Take 1 tablet by mouth  2 (two) times daily.   Multivitamin Women Tabs Take 1 tablet by mouth daily.   PreserVision AREDS Caps Take 2 capsules by mouth every evening.   pantoprazole 20 MG tablet Commonly known as: PROTONIX Take 20 mg by mouth daily.   PROBIOTIC PO Take 1 capsule by mouth daily.   traZODone 50 MG tablet Commonly known as: DESYREL Take 25 mg by mouth at bedtime.        Consultations: Cardiology  Procedures/Studies:   US Venous Img Lower Unilateral Left  Result Date: 05/20/2021 CLINICAL DATA:  Left leg pain and swelling for several months, initial encounter EXAM: LEFT LOWER EXTREMITY VENOUS DOPPLER ULTRASOUND TECHNIQUE: Gray-scale sonography with graded compression, as well as color Doppler and duplex ultrasound were performed to evaluate the lower extremity deep venous systems from the level of the common femoral vein and including the common femoral, femoral, profunda femoral, popliteal and calf veins including the posterior tibial, peroneal and gastrocnemius veins when visible. The superficial great saphenous vein was also interrogated. Spectral Doppler was utilized to evaluate flow at rest and with distal augmentation maneuvers in the common femoral, femoral and popliteal veins. COMPARISON:  None. FINDINGS: Contralateral Common Femoral Vein: Respiratory phasicity is normal  and symmetric with the symptomatic side. No evidence of thrombus. Normal compressibility. Common Femoral Vein: No evidence of thrombus. Normal compressibility, respiratory phasicity and response to augmentation. Saphenofemoral Junction: No evidence of thrombus. Normal compressibility and flow on color Doppler imaging. Profunda Femoral Vein: No evidence of thrombus. Normal compressibility and flow on color Doppler imaging. Femoral Vein: No evidence of thrombus. Normal compressibility, respiratory phasicity and response to augmentation. Popliteal Vein: No evidence of thrombus. Normal compressibility, respiratory phasicity and response to augmentation. Calf Veins: No evidence of thrombus. Normal compressibility and flow on color Doppler imaging. Superficial Great Saphenous Vein: No evidence of thrombus. Normal compressibility. Venous Reflux:  None. Other Findings:  None. IMPRESSION: No evidence of deep venous thrombosis. Electronically Signed   By: Inez Catalina M.D.   On: 05/20/2021 21:15   DG Chest Portable 1 View  Result Date: 05/20/2021 CLINICAL DATA:  Shortness of breath EXAM: PORTABLE CHEST 1 VIEW COMPARISON:  None. FINDINGS: Cardiac shadow is at the upper limits of normal in size. Aortic calcifications are noted. Vascular congestion is noted with interstitial and parenchymal edema. Mild basilar atelectasis is noted slightly worse on the right than the left. No bony abnormality is noted. IMPRESSION: Vascular congestion and edema consistent with CHF. Bibasilar atelectatic changes are noted. Electronically Signed   By: Inez Catalina M.D.   On: 05/20/2021 20:06   ECHOCARDIOGRAM COMPLETE  Result Date: 05/21/2021    ECHOCARDIOGRAM REPORT   Patient Name:   [name] Date of Exam: 05/21/2021 Medical Rec #:  [id number]   Height:       62.0 in Accession #:    [id number]  Weight:       214.5 lb Date of Birth:  07-19-1933    BSA:          1.969 m Patient Age:    62 years    BP:           133/73 mmHg Patient Gender: F            HR:           79 bpm. Exam Location:  [facility] Procedure: 2D Echo, Color Doppler and Cardiac Doppler Indications:     I50.31 congestive heart failure-Acute Diastolic  History:  Patient has no prior history of Echocardiogram examinations.                  Stroke; Risk Factors:Hypertension and Sleep Apnea.  Sonographer:     Charmayne Sheer Referring Phys:  [id number] AMBER SCOGGINS Diagnosing Phys: Neoma Laming  Sonographer Comments: Suboptimal subcostal window. IMPRESSIONS  1. Left ventricular ejection fraction, by estimation, is 60 to 65%. The left ventricle has normal function. The left ventricle has no regional wall motion abnormalities. The left ventricular internal cavity size was mildly dilated. There is mild concentric left ventricular hypertrophy. Left ventricular diastolic parameters are consistent with Grade III diastolic dysfunction (restrictive).  2. Right ventricular systolic function is normal. The right ventricular size is mildly enlarged.  3. Left atrial size was moderately dilated.  4. Right atrial size was moderately dilated.  5. The mitral valve is normal in structure. Trivial mitral valve regurgitation. No evidence of mitral stenosis.  6. The aortic valve is normal in structure. Aortic valve regurgitation is not visualized. No aortic stenosis is present.  7. The inferior vena cava is normal in size with greater than 50% respiratory variability, suggesting right atrial pressure of 3 mmHg. FINDINGS  Left Ventricle: Left ventricular ejection fraction, by estimation, is 60 to 65%. The left ventricle has normal function. The left ventricle has no regional wall motion abnormalities. The left ventricular internal cavity size was mildly dilated. There is  mild concentric left ventricular hypertrophy. Left ventricular diastolic parameters are consistent with Grade III diastolic dysfunction (restrictive). Right Ventricle: The right ventricular size is mildly enlarged. No increase in right ventricular  wall thickness. Right ventricular systolic function is normal. Left Atrium: Left atrial size was moderately dilated. Right Atrium: Right atrial size was moderately dilated. Pericardium: There is no evidence of pericardial effusion. Mitral Valve: The mitral valve is normal in structure. Trivial mitral valve regurgitation. No evidence of mitral valve stenosis. MV peak gradient, 2.8 mmHg. The mean mitral valve gradient is 1.0 mmHg. Tricuspid Valve: The tricuspid valve is normal in structure. Tricuspid valve regurgitation is trivial. No evidence of tricuspid stenosis. Aortic Valve: The aortic valve is normal in structure. Aortic valve regurgitation is not visualized. No aortic stenosis is present. Aortic valve mean gradient measures 4.0 mmHg. Aortic valve peak gradient measures 6.2 mmHg. Aortic valve area, by VTI measures 2.64 cm. Pulmonic Valve: The pulmonic valve was normal in structure. Pulmonic valve regurgitation is not visualized. No evidence of pulmonic stenosis. Aorta: The aortic root is normal in size and structure. Venous: The inferior vena cava is normal in size with greater than 50% respiratory variability, suggesting right atrial pressure of 3 mmHg. IAS/Shunts: No atrial level shunt detected by color flow Doppler.  LEFT VENTRICLE PLAX 2D LVIDd:         4.57 cm   Diastology LVIDs:         3.00 cm   LV e' medial:    6.96 cm/s LV PW:         0.85 cm   LV E/e' medial:  13.9 LV IVS:        0.80 cm   LV e' lateral:   8.70 cm/s LVOT diam:     2.20 cm   LV E/e' lateral: 11.1 LV SV:         55 LV SV Index:   28 LVOT Area:     3.80 cm  LEFT ATRIUM             Index LA diam:  4.20 cm 2.13 cm/m LA Vol (A2C):   41.2 ml 20.92 ml/m LA Vol (A4C):   42.1 ml 21.38 ml/m LA Biplane Vol: 43.8 ml 22.24 ml/m  AORTIC VALVE                    PULMONIC VALVE AV Area (Vmax):    2.24 cm     PV Vmax:       0.89 m/s AV Area (Vmean):   2.29 cm     PV Vmean:      64.400 cm/s AV Area (VTI):     2.64 cm     PV VTI:         0.151 m AV Vmax:           125.00 cm/s  PV Peak grad:  3.1 mmHg AV Vmean:          88.000 cm/s  PV Mean grad:  2.0 mmHg AV VTI:            0.207 m AV Peak Grad:      6.2 mmHg AV Mean Grad:      4.0 mmHg LVOT Vmax:         73.80 cm/s LVOT Vmean:        53.100 cm/s LVOT VTI:          0.144 m LVOT/AV VTI ratio: 0.70  AORTA Ao Root diam: 2.90 cm MITRAL VALVE MV Area (PHT): 3.33 cm    SHUNTS MV Area VTI:   2.61 cm    Systemic VTI:  0.14 m MV Peak grad:  2.8 mmHg    Systemic Diam: 2.20 cm MV Mean grad:  1.0 mmHg MV Vmax:       0.84 m/s MV Vmean:      51.4 cm/s MV Decel Time: 228 msec MV E velocity: 96.80 cm/s MV A velocity: 47.10 cm/s MV E/A ratio:  2.06 Shaukat Khan Electronically signed by Neoma Laming Signature Date/Time: 05/21/2021/3:49:44 PM    Final        The results of significant diagnostics from this hospitalization (including imaging, microbiology, ancillary and laboratory) are listed below for reference.     Microbiology: Recent Results (from the past 240 hour(s))  Resp Panel by RT-PCR (Flu A&B, Covid) Nasopharyngeal Swab     Status: None   Collection Time: 05/20/21  7:46 PM   Specimen: Nasopharyngeal Swab; Nasopharyngeal(NP) swabs in vial transport medium  Result Value Ref Range Status   SARS Coronavirus 2 by RT PCR NEGATIVE NEGATIVE Final    Comment: (NOTE) SARS-CoV-2 target nucleic acids are NOT DETECTED.  The SARS-CoV-2 RNA is generally detectable in upper respiratory specimens during the acute phase of infection. The lowest concentration of SARS-CoV-2 viral copies this assay can detect is 138 copies/mL. A negative result does not preclude SARS-Cov-2 infection and should not be used as the sole basis for treatment or other patient management decisions. A negative result may occur with  improper specimen collection/handling, submission of specimen other than nasopharyngeal swab, presence of viral mutation(s) within the areas targeted by this assay, and inadequate number of  viral copies(<138 copies/mL). A negative result must be combined with clinical observations, patient history, and epidemiological information. The expected result is Negative.  Fact Sheet for Patients:  EntrepreneurPulse.com.au  Fact Sheet for Healthcare Providers:  IncredibleEmployment.be  This test is no t yet approved or cleared by the [place] FDA and  has been authorized for detection and/or diagnosis of SARS-CoV-2 by FDA under an Emergency  Use Authorization (EUA). This EUA will remain  in effect (meaning this test can be used) for the duration of the COVID-19 declaration under Section 564(b)(1) of the Act, 21 U.S.C.section 360bbb-3(b)(1), unless the authorization is terminated  or revoked sooner.       Influenza A by PCR NEGATIVE NEGATIVE Final   Influenza B by PCR NEGATIVE NEGATIVE Final    Comment: (NOTE) The Xpert Xpress SARS-CoV-2/FLU/RSV plus assay is intended as an aid in the diagnosis of influenza from Nasopharyngeal swab specimens and should not be used as a sole basis for treatment. Nasal washings and aspirates are unacceptable for Xpert Xpress SARS-CoV-2/FLU/RSV testing.  Fact Sheet for Patients: EntrepreneurPulse.com.au  Fact Sheet for Healthcare Providers: IncredibleEmployment.be  This test is not yet approved or cleared by the [place] FDA and has been authorized for detection and/or diagnosis of SARS-CoV-2 by FDA under an Emergency Use Authorization (EUA). This EUA will remain in effect (meaning this test can be used) for the duration of the COVID-19 declaration under Section 564(b)(1) of the Act, 21 U.S.C. section 360bbb-3(b)(1), unless the authorization is terminated or revoked.  Performed at [facility], [address] [place], [place] [address]      Labs:  CBC: Recent Labs  Lab 05/20/21 1946 05/22/21 0527  WBC 10.6* 9.2  NEUTROABS 8.3*  --   HGB  13.1 11.5*  HCT 38.9 34.8*  MCV 93.1 94.1  PLT 278 268   BMP &GFR Recent Labs  Lab 05/20/21 1946 05/21/21 0241 05/21/21 0902 05/22/21 0527  NA 138 136  --  138  K 3.1* 3.1*  --  3.7  CL 101 100  --  98  CO2 26 27  --  30  GLUCOSE 128* 209*  --  113*  BUN 15 17  --  22  CREATININE 0.74 1.01*  --  0.75  CALCIUM 9.1 9.0  --  8.9  MG  --   --  2.3 2.4  PHOS  --   --   --  3.1   Estimated Creatinine Clearance: 54.7 mL/min (by C-G formula based on SCr of 0.75 mg/dL). Liver & Pancreas: Recent Labs  Lab 05/20/21 1946 05/22/21 0527  AST 17  --   ALT 18  --   ALKPHOS 75  --   BILITOT 1.1  --   PROT 6.3*  --   ALBUMIN 3.7 3.4*   No results for input(s): LIPASE, AMYLASE in the last 168 hours. No results for input(s): AMMONIA in the last 168 hours. Diabetic: No results for input(s): HGBA1C in the last 72 hours. No results for input(s): GLUCAP in the last 168 hours. Cardiac Enzymes: No results for input(s): CKTOTAL, CKMB, CKMBINDEX, TROPONINI in the last 168 hours. No results for input(s): PROBNP in the last 8760 hours. Coagulation Profile: Recent Labs  Lab 05/21/21 0241  INR 1.2   Thyroid Function Tests: No results for input(s): TSH, T4TOTAL, FREET4, T3FREE, THYROIDAB in the last 72 hours. Lipid Profile: No results for input(s): CHOL, HDL, LDLCALC, TRIG, CHOLHDL, LDLDIRECT in the last 72 hours. Anemia Panel: No results for input(s): VITAMINB12, FOLATE, FERRITIN, TIBC, IRON, RETICCTPCT in the last 72 hours. Urine analysis:    Component Value Date/Time   APPEARANCEUR Clear 12/09/2020 1546   GLUCOSEU Negative 12/09/2020 1546   BILIRUBINUR Negative 12/09/2020 1546   PROTEINUR Negative 12/09/2020 1546   NITRITE Positive (A) 12/09/2020 1546   LEUKOCYTESUR Negative 12/09/2020 1546   Sepsis Labs: Invalid input(s): PROCALCITONIN, LACTICIDVEN   Time coordinating  discharge: 45 minutes  SIGNED:  Mercy Riding, MD  [facility] 05/22/2021, 3:57 PM

## 2021-05-22 NOTE — Progress Notes (Signed)
Mobility Specialist - Progress Note   05/22/21 1600  Mobility  Activity Transferred from bed to chair  Level of Assistance Standby assist, set-up cues, supervision of patient - no hands on  Assistive Device None;Wheelchair  Distance Ambulated (ft) 2 ft  Activity Response Tolerated well  $Mobility charge 1 Mobility    Pt attempting to exit recliner with leg rest still up on arrival. Assisted with donning UB garments and transfer to wheelchair for d/c transportation.    Kathee Delton Mobility Specialist 05/22/21, 4:03 PM

## 2021-05-22 NOTE — Progress Notes (Signed)
Patient Saturations on Room Air at Rest = 93%  Patient Saturations on [employer] while Ambulating = 90%

## 2021-09-11 ENCOUNTER — Other Ambulatory Visit: Payer: Self-pay

## 2021-09-11 ENCOUNTER — Emergency Department: Payer: Medicare Other

## 2021-09-11 ENCOUNTER — Inpatient Hospital Stay
Admission: EM | Admit: 2021-09-11 | Discharge: 2021-09-14 | DRG: 065 | Disposition: A | Discharge status: Home Health | Payer: Medicare Other | Attending: Internal Medicine | Admitting: Internal Medicine

## 2021-09-11 ENCOUNTER — Observation Stay: Payer: Medicare Other

## 2021-09-11 DIAGNOSIS — G4700 Insomnia, unspecified: Secondary | ICD-10-CM | POA: Diagnosis present

## 2021-09-11 DIAGNOSIS — I6381 Other cerebral infarction due to occlusion or stenosis of small artery: Principal | ICD-10-CM | POA: Diagnosis present

## 2021-09-11 DIAGNOSIS — Z7982 Long term (current) use of aspirin: Secondary | ICD-10-CM

## 2021-09-11 DIAGNOSIS — R32 Unspecified urinary incontinence: Secondary | ICD-10-CM | POA: Diagnosis present

## 2021-09-11 DIAGNOSIS — I10 Essential (primary) hypertension: Secondary | ICD-10-CM | POA: Diagnosis not present

## 2021-09-11 DIAGNOSIS — Z79899 Other long term (current) drug therapy: Secondary | ICD-10-CM

## 2021-09-11 DIAGNOSIS — K219 Gastro-esophageal reflux disease without esophagitis: Secondary | ICD-10-CM | POA: Diagnosis present

## 2021-09-11 DIAGNOSIS — W06XXXA Fall from bed, initial encounter: Secondary | ICD-10-CM | POA: Diagnosis present

## 2021-09-11 DIAGNOSIS — Z87891 Personal history of nicotine dependence: Secondary | ICD-10-CM

## 2021-09-11 DIAGNOSIS — F32A Depression, unspecified: Secondary | ICD-10-CM | POA: Diagnosis present

## 2021-09-11 DIAGNOSIS — I639 Cerebral infarction, unspecified: Secondary | ICD-10-CM

## 2021-09-11 DIAGNOSIS — A419 Sepsis, unspecified organism: Secondary | ICD-10-CM | POA: Insufficient documentation

## 2021-09-11 DIAGNOSIS — R29706 NIHSS score 6: Secondary | ICD-10-CM | POA: Diagnosis present

## 2021-09-11 DIAGNOSIS — R778 Other specified abnormalities of plasma proteins: Secondary | ICD-10-CM | POA: Diagnosis not present

## 2021-09-11 DIAGNOSIS — Z7902 Long term (current) use of antithrombotics/antiplatelets: Secondary | ICD-10-CM

## 2021-09-11 DIAGNOSIS — I4891 Unspecified atrial fibrillation: Secondary | ICD-10-CM

## 2021-09-11 DIAGNOSIS — Z8249 Family history of ischemic heart disease and other diseases of the circulatory system: Secondary | ICD-10-CM

## 2021-09-11 DIAGNOSIS — N390 Urinary tract infection, site not specified: Secondary | ICD-10-CM | POA: Diagnosis not present

## 2021-09-11 DIAGNOSIS — R410 Disorientation, unspecified: Secondary | ICD-10-CM | POA: Diagnosis not present

## 2021-09-11 DIAGNOSIS — Z8616 Personal history of COVID-19: Secondary | ICD-10-CM | POA: Diagnosis present

## 2021-09-11 DIAGNOSIS — I480 Paroxysmal atrial fibrillation: Secondary | ICD-10-CM | POA: Diagnosis present

## 2021-09-11 DIAGNOSIS — F0393 Unspecified dementia, unspecified severity, with mood disturbance: Secondary | ICD-10-CM | POA: Diagnosis present

## 2021-09-11 DIAGNOSIS — Y92009 Unspecified place in unspecified non-institutional (private) residence as the place of occurrence of the external cause: Secondary | ICD-10-CM

## 2021-09-11 DIAGNOSIS — R471 Dysarthria and anarthria: Secondary | ICD-10-CM | POA: Diagnosis present

## 2021-09-11 DIAGNOSIS — I472 Ventricular tachycardia, unspecified: Secondary | ICD-10-CM | POA: Diagnosis not present

## 2021-09-11 DIAGNOSIS — G4730 Sleep apnea, unspecified: Secondary | ICD-10-CM | POA: Diagnosis present

## 2021-09-11 DIAGNOSIS — Z818 Family history of other mental and behavioral disorders: Secondary | ICD-10-CM

## 2021-09-11 DIAGNOSIS — R296 Repeated falls: Secondary | ICD-10-CM | POA: Diagnosis present

## 2021-09-11 DIAGNOSIS — R400 Somnolence: Secondary | ICD-10-CM | POA: Diagnosis present

## 2021-09-11 DIAGNOSIS — Z20822 Contact with and (suspected) exposure to covid-19: Secondary | ICD-10-CM | POA: Diagnosis present

## 2021-09-11 DIAGNOSIS — Z66 Do not resuscitate: Secondary | ICD-10-CM | POA: Diagnosis present

## 2021-09-11 DIAGNOSIS — Z9841 Cataract extraction status, right eye: Secondary | ICD-10-CM

## 2021-09-11 DIAGNOSIS — E876 Hypokalemia: Secondary | ICD-10-CM | POA: Diagnosis present

## 2021-09-11 DIAGNOSIS — I4729 Other ventricular tachycardia: Secondary | ICD-10-CM

## 2021-09-11 DIAGNOSIS — F5101 Primary insomnia: Secondary | ICD-10-CM

## 2021-09-11 DIAGNOSIS — R413 Other amnesia: Secondary | ICD-10-CM | POA: Diagnosis present

## 2021-09-11 DIAGNOSIS — Z9842 Cataract extraction status, left eye: Secondary | ICD-10-CM

## 2021-09-11 DIAGNOSIS — R4182 Altered mental status, unspecified: Principal | ICD-10-CM

## 2021-09-11 DIAGNOSIS — I110 Hypertensive heart disease with heart failure: Secondary | ICD-10-CM | POA: Diagnosis present

## 2021-09-11 DIAGNOSIS — Z8673 Personal history of transient ischemic attack (TIA), and cerebral infarction without residual deficits: Secondary | ICD-10-CM

## 2021-09-11 DIAGNOSIS — I5032 Chronic diastolic (congestive) heart failure: Secondary | ICD-10-CM | POA: Diagnosis present

## 2021-09-11 DIAGNOSIS — F03918 Unspecified dementia, unspecified severity, with other behavioral disturbance: Secondary | ICD-10-CM | POA: Diagnosis present

## 2021-09-11 LAB — PHOSPHORUS: Phosphorus: 3.5 mg/dL (ref 2.5–4.6)

## 2021-09-11 LAB — CBC WITH DIFFERENTIAL/PLATELET
Abs Immature Granulocytes: 0.03 10*3/uL (ref 0.00–0.07)
Basophils Absolute: 0 10*3/uL (ref 0.0–0.1)
Basophils Relative: 0 %
Eosinophils Absolute: 0.1 10*3/uL (ref 0.0–0.5)
Eosinophils Relative: 1 %
HCT: 37.7 % (ref 36.0–46.0)
Hemoglobin: 12.9 g/dL (ref 12.0–15.0)
Immature Granulocytes: 0 %
Lymphocytes Relative: 10 %
Lymphs Abs: 1 10*3/uL (ref 0.7–4.0)
MCH: 30.5 pg (ref 26.0–34.0)
MCHC: 34.2 g/dL (ref 30.0–36.0)
MCV: 89.1 fL (ref 80.0–100.0)
Monocytes Absolute: 0.9 10*3/uL (ref 0.1–1.0)
Monocytes Relative: 10 %
Neutro Abs: 7.3 10*3/uL (ref 1.7–7.7)
Neutrophils Relative %: 79 %
Platelets: 260 10*3/uL (ref 150–400)
RBC: 4.23 MIL/uL (ref 3.87–5.11)
RDW: 13.3 % (ref 11.5–15.5)
WBC: 9.3 10*3/uL (ref 4.0–10.5)
nRBC: 0 % (ref 0.0–0.2)

## 2021-09-11 LAB — URINALYSIS, ROUTINE W REFLEX MICROSCOPIC
Bilirubin Urine: NEGATIVE
Glucose, UA: NEGATIVE mg/dL
Hgb urine dipstick: NEGATIVE
Ketones, ur: 20 mg/dL — AB
Leukocytes,Ua: NEGATIVE
Nitrite: NEGATIVE
Protein, ur: NEGATIVE mg/dL
Specific Gravity, Urine: 1.017 (ref 1.005–1.030)
pH: 6 (ref 5.0–8.0)

## 2021-09-11 LAB — COMPREHENSIVE METABOLIC PANEL
ALT: 28 U/L (ref 0–44)
AST: 40 U/L (ref 15–41)
Albumin: 3.8 g/dL (ref 3.5–5.0)
Alkaline Phosphatase: 84 U/L (ref 38–126)
Anion gap: 8 (ref 5–15)
BUN: 14 mg/dL (ref 8–23)
CO2: 29 mmol/L (ref 22–32)
Calcium: 9 mg/dL (ref 8.9–10.3)
Chloride: 98 mmol/L (ref 98–111)
Creatinine, Ser: 0.72 mg/dL (ref 0.44–1.00)
GFR, Estimated: >60 mL/min (ref >60)
Glucose, Bld: 127 mg/dL — ABNORMAL HIGH (ref 70–99)
Potassium: 2.8 mmol/L — ABNORMAL LOW (ref 3.5–5.1)
Sodium: 135 mmol/L (ref 135–145)
Total Bilirubin: 1.1 mg/dL (ref 0.3–1.2)
Total Protein: 6.8 g/dL (ref 6.5–8.1)

## 2021-09-11 LAB — TROPONIN I (HIGH SENSITIVITY)
Troponin I (High Sensitivity): 19 ng/L — ABNORMAL HIGH (ref <18)
Troponin I (High Sensitivity): 19 ng/L — ABNORMAL HIGH (ref <18)

## 2021-09-11 LAB — RESP PANEL BY RT-PCR (FLU A&B, COVID) ARPGX2
Influenza A by PCR: NEGATIVE
Influenza B by PCR: NEGATIVE
SARS Coronavirus 2 by RT PCR: NEGATIVE

## 2021-09-11 LAB — VITAMIN B12: Vitamin B-12: 377 pg/mL (ref 180–914)

## 2021-09-11 LAB — LACTIC ACID, PLASMA
Lactic Acid, Venous: 1 mmol/L (ref 0.5–1.9)
Lactic Acid, Venous: 0.7 mmol/L (ref 0.5–1.9)

## 2021-09-11 LAB — MAGNESIUM: Magnesium: 2.1 mg/dL (ref 1.7–2.4)

## 2021-09-11 LAB — TSH: TSH: 2.421 u[IU]/mL (ref 0.350–4.500)

## 2021-09-11 MED ORDER — STROKE: EARLY STAGES OF RECOVERY BOOK: Freq: Once | Status: AC

## 2021-09-11 MED ORDER — CITALOPRAM HYDROBROMIDE 20 MG PO TABS
20.0000 mg | ORAL_TABLET | Freq: Every day | ORAL | Status: DC
  Administered 2021-09-11 – 2021-09-14 (×4): 20 mg via ORAL
  Filled 2021-09-11 (×4): qty 1

## 2021-09-11 MED ORDER — ONDANSETRON HCL 4 MG/2ML IJ SOLN: 4.0000 mg | Freq: Four times a day (QID) | INTRAMUSCULAR | Status: DC | PRN

## 2021-09-11 MED ORDER — ALBUTEROL SULFATE (2.5 MG/3ML) 0.083% IN NEBU: 3.0000 mL | INHALATION_SOLUTION | Freq: Four times a day (QID) | RESPIRATORY_TRACT | Status: DC | PRN

## 2021-09-11 MED ORDER — ADULT MULTIVITAMIN W/MINERALS CH
1.0000 | ORAL_TABLET | Freq: Every day | ORAL | Status: DC
  Administered 2021-09-11 – 2021-09-14 (×4): 1 via ORAL
  Filled 2021-09-11 (×4): qty 1

## 2021-09-11 MED ORDER — MEMANTINE HCL 5 MG PO TABS
10.0000 mg | ORAL_TABLET | Freq: Two times a day (BID) | ORAL | Status: DC
  Administered 2021-09-11 – 2021-09-14 (×6): 10 mg via ORAL
  Filled 2021-09-11 (×7): qty 2

## 2021-09-11 MED ORDER — POLYETHYLENE GLYCOL 3350 17 G PO PACK
17.0000 g | PACK | Freq: Every day | ORAL | Status: DC | PRN
  Filled 2021-09-11: qty 1

## 2021-09-11 MED ORDER — ACETAMINOPHEN 500 MG PO TABS: 1000.0000 mg | ORAL_TABLET | Freq: Four times a day (QID) | ORAL | Status: AC | PRN

## 2021-09-11 MED ORDER — POTASSIUM CHLORIDE 10 MEQ/100ML IV SOLN
10.0000 meq | INTRAVENOUS | Status: AC
  Administered 2021-09-11 (×6): 10 meq via INTRAVENOUS
  Filled 2021-09-11 (×2): qty 100

## 2021-09-11 MED ORDER — SODIUM CHLORIDE 0.9 % IV SOLN
1.0000 g | INTRAVENOUS | Status: DC
  Administered 2021-09-12: 1 g via INTRAVENOUS
  Filled 2021-09-11: qty 1

## 2021-09-11 MED ORDER — SODIUM CHLORIDE 0.9 % IV SOLN
1.0000 g | Freq: Once | INTRAVENOUS | Status: AC
  Administered 2021-09-11: 1 g via INTRAVENOUS
  Filled 2021-09-11: qty 10

## 2021-09-11 MED ORDER — ONDANSETRON HCL 4 MG PO TABS: 4.0000 mg | ORAL_TABLET | Freq: Four times a day (QID) | ORAL | Status: DC | PRN

## 2021-09-11 MED ORDER — ACETAMINOPHEN 650 MG RE SUPP: 650.0000 mg | Freq: Four times a day (QID) | RECTAL | Status: AC | PRN

## 2021-09-11 MED ORDER — FUROSEMIDE 40 MG PO TABS
40.0000 mg | ORAL_TABLET | Freq: Every day | ORAL | Status: DC
  Administered 2021-09-11 – 2021-09-14 (×4): 40 mg via ORAL
  Filled 2021-09-11 (×4): qty 1

## 2021-09-11 MED ORDER — MELATONIN 5 MG PO TABS: 5.0000 mg | ORAL_TABLET | Freq: Every evening | ORAL | Status: DC | PRN

## 2021-09-11 MED ORDER — PANTOPRAZOLE SODIUM 20 MG PO TBEC
20.0000 mg | DELAYED_RELEASE_TABLET | Freq: Every day | ORAL | Status: DC
  Administered 2021-09-11 – 2021-09-14 (×4): 20 mg via ORAL
  Filled 2021-09-11 (×5): qty 1

## 2021-09-11 MED ORDER — LORAZEPAM 2 MG/ML IJ SOLN: 2.0000 mg | INTRAMUSCULAR | Status: DC | PRN

## 2021-09-11 MED ORDER — ENOXAPARIN SODIUM 60 MG/0.6ML IJ SOSY
0.5000 mg/kg | PREFILLED_SYRINGE | Freq: Every day | INTRAMUSCULAR | Status: DC
  Administered 2021-09-12 – 2021-09-13 (×2): 45 mg via SUBCUTANEOUS
  Filled 2021-09-11 (×2): qty 0.6

## 2021-09-11 MED ORDER — BUPROPION HCL ER (SR) 150 MG PO TB12
150.0000 mg | ORAL_TABLET | Freq: Two times a day (BID) | ORAL | Status: DC
  Administered 2021-09-11 – 2021-09-14 (×6): 150 mg via ORAL
  Filled 2021-09-11 (×8): qty 1

## 2021-09-11 MED ORDER — ASPIRIN EC 81 MG PO TBEC: 81.0000 mg | DELAYED_RELEASE_TABLET | ORAL | Status: DC

## 2021-09-11 MED ORDER — TOBRAMYCIN 0.3 % OP SOLN
1.0000 [drp] | OPHTHALMIC | Status: DC
  Administered 2021-09-11 – 2021-09-14 (×15): 1 [drp] via OPHTHALMIC
  Filled 2021-09-11: qty 5

## 2021-09-11 MED ORDER — LOSARTAN POTASSIUM 50 MG PO TABS
50.0000 mg | ORAL_TABLET | Freq: Every day | ORAL | Status: DC
  Administered 2021-09-11 – 2021-09-14 (×4): 50 mg via ORAL
  Filled 2021-09-11 (×4): qty 1

## 2021-09-11 MED ORDER — CLOPIDOGREL BISULFATE 75 MG PO TABS
75.0000 mg | ORAL_TABLET | Freq: Every day | ORAL | Status: DC
  Administered 2021-09-11 – 2021-09-13 (×3): 75 mg via ORAL
  Filled 2021-09-11 (×3): qty 1

## 2021-09-11 NOTE — Assessment & Plan Note (Addendum)
Likely secondary to stroke 

## 2021-09-11 NOTE — Assessment & Plan Note (Addendum)
Brief atrial fibrillation in the emergency room.  Currently in normal sinus rhythm. ?

## 2021-09-11 NOTE — ED Notes (Signed)
PT daughter has arrived to room. Daughter stating that pt has had increasing confusion since last Thursday. Pt was diagnosed with a UTI and was rx abx. Since then pt confusion has persisted and has not resolved. Prior to this event patient has been oriented x4. Pt is currently only oriented to self ?

## 2021-09-11 NOTE — Assessment & Plan Note (Addendum)
Continue home bupropion 150 mg p.o. twice daily, citalopram 20 mg daily ?

## 2021-09-11 NOTE — Assessment & Plan Note (Addendum)
Replace daily potassium since on Lasix. ?

## 2021-09-11 NOTE — Assessment & Plan Note (Deleted)
Trazodone discontinued. Melatonin 5 mg nightly as needed for sleep ordered ?

## 2021-09-11 NOTE — Assessment & Plan Note (Addendum)
Mental status improved from admission. ?

## 2021-09-11 NOTE — Assessment & Plan Note (Deleted)
-   Aspirin 81 mg, Plavix 75 mg daily resumed ?

## 2021-09-11 NOTE — ED Notes (Signed)
Pt repositioned in bed, assessment unchanged. Pt oriented to self only ?

## 2021-09-11 NOTE — Hospital Course (Addendum)
Pamela Wolfe is a 88 year old female with history of hypertension, CVA, GERD, memory loss.  Presented to the emergency room with difficulty speaking, falls.  By MRI she was found to have acute to subacute stroke of the right nasal cannula ? ? ?

## 2021-09-11 NOTE — ED Notes (Signed)
Spoke with pt granddaughter Faith. She stating that her mother should be here any minute. ?

## 2021-09-11 NOTE — Assessment & Plan Note (Deleted)
Urinalysis negative.  Discontinue antibiotics. ?

## 2021-09-11 NOTE — ED Notes (Signed)
Report given to Julia, RN

## 2021-09-11 NOTE — H&P (Addendum)
?History and Physical  ? ?Pamela Wolfe VKP:[id number] DOB: 1934/02/27 DOA: 09/11/2021 ? ?PCP: Marinda Elk, MD ?Outpatient Specialists: Dr. Melrose Nakayama, neurology ?Patient coming from: Home ? ?I have personally briefly reviewed patient's old medical records in [place]. ? ?Chief Concern: Altered mental status, increased fall in the last 24 hours ? ?HPI: Ms. Pamela Wolfe is a 88 year old female with history of hypertension, CVA, GERD, heart failure preserved ejection fraction, heart failure grade 3 diastolic dysfunction, recent diagnosis of UTI, memory decline concerning for dementia, who presents emergency department for chief concerns of frequent falls in the last 24 hours. ? ?Initial vitals in the emergency department showed temperature of 98.3, respiration rate of 18, heart rate of 86, blood pressure 157/85, SPO2 of 94% on room air. ? ?Serum sodium is 134, potassium 2.8, chloride 98, bicarb 29, BUN of 14, serum creatinine of 0.72, GFR greater than 60, nonfasting blood glucose 127, WBC 9.3, hemoglobin 12.9, platelets of 260. ? ?COVID/influenza A/influenza B PCR were negative. ?High sensitive troponin was 19.  Lactic acid was 1.0. ? ?UA in the emergency department collected was negative for leukocytes or nitrates. ? ?Blood cultures x2 have been collected and are in fact, with repeat urine culture collected and is in process. ? ?ED treatment: ceftriaxone 1 g IV. ? ?At bedside, she was able to tell me her name. She was not able to tell me her age, location, or current year. She was not able to identify her daughter at bedside.  ? ?She had covid 19 about two weeks ago.  ? ?At baseline, daughter states that she knows daughter, feeds herself and goes to senior daycare. She has been urinating in her depends and pads at daycare. ? ?She normally wakes up early in the morning. She was found on the floor at 7:40 am when daughter woke up in the morning and looked at the camera. Per camera, at 3:40 AM, she slid off the  bed and layed on the floor until 7:40 am, prompting daughter to call ems.  ? ?Social history: She lives with her daughter, [name]. She is a former tobacco user, quitting at age 54. She does not drink etoh anymore. She is retired and formerly worked as Designer, multimedia at [employer]. ? ?Vaccination history: She is vaccinated for covid 19 and influenza.  ? ?ROS: Unable to complete due to altered mental status and baseline demnentia ? ?ED Course: Discussed with emergency medicine provider, patient requiring hospitalization for chief concerns of altered mental status. ? ?Assessment/Plan ? ?Principal Problem: ?  Altered mental status ?Active Problems: ?  Essential hypertension ?  History of stroke ?  Memory loss ?  Depression ?  UTI (urinary tract infection) ?  Hypokalemia ?  Insomnia ?  Paroxysmal atrial fibrillation (HCC) ?  History of COVID-19 ?  Elevated troponin ?  ?Assessment and Plan: ? ?* Altered mental status ?- Etiology work-up in progress differentials include UTI in setting of patient with dementia ?- Check B12, TSH, blood cultures x2 have been ordered and are in process ?- Continue with ceftriaxone 1 g per EDP ?- MRI of the brain without contrast completed and positive for small acute to subacute right basal ganglia infarct. ?- Neurology has been consulted via staff message, to Dr. Curly Shores to assess for possible embolic stroke ?- Fall precautions ? ?Elevated troponin ?- Presumed secondary to new onset ? ?Paroxysmal atrial fibrillation (HCC) ?- New onset ?- Complete echo ordered ? ?Insomnia ?- Patient takes trazodone 25 mg nightly however  it was noted that patient becomes strange and unusual after taking this medication therefore has not been taking this ?- I have not ordered trazodone given patient's presentation of altered mental status from baseline dementia ?- Melatonin 5 mg nightly as needed for sleep ordered ? ?Hypokalemia ?- Check magnesium level ?- Potassium chloride 10 mill equivalent per hour, 6  treatments ordered ?- P.o. potassium replacement has not been ordered due to patient more confused and requiring a swallow study ?- BMP in the a.m. ? ?UTI (urinary tract infection) ?- Present on admission ?- Urine culture collected on 09/07/2021: Grew E. coli pansensitive in care everywhere ?- Status post ceftriaxone 1 g IV per EDP ?- Continue with ceftriaxone 1 g IV daily starting on 09/12/2021, 5 doses ordered ? ?Depression ?- Resumed home bupropion 150 mg p.o. twice daily, citalopram 20 mg daily ? ?Memory loss ?- Resumed memantine 10 mg twice daily p.o. ? ?History of stroke ?- Aspirin 81 mg, Plavix 75 mg daily resumed ? ?Essential hypertension ?- Resume losartan 50 mg daily, furosemide 40 mg p.o. daily ? ?Chart reviewed.  ? ?DVT prophylaxis: Enoxaparin subcutaneous daily ?Code Status: DNR/DNI ?Diet: N.p.o. pending SLP ?Family Communication: Updated daughter at bedside ?Disposition Plan: Pending clinical course ?Consults called: SLP, neurology neurology ?Admission status: Telemetry medical, observation ? ?Past Medical History:  ?Diagnosis Date  ? Depression   ? Essential hypertension 01/26/2017  ? GERD (gastroesophageal reflux disease)   ? History of stroke 12/08/2016  ? Loss of memory 12/08/2016  ? Macular degeneration   ? Neck pain 12/08/2016  ? Recurrent major depressive disorder, in full remission ([place]) 01/26/2017  ? Sleep apnea   ? Tremor 02/06/2017  ? ?Past Surgical History:  ?Procedure Laterality Date  ? ABDOMINAL HYSTERECTOMY  1975  ? BUNIONECTOMY  2016  ? CATARACT EXTRACTION, BILATERAL    ? CHOLECYSTECTOMY    ? SALIVARY GLAND SURGERY    ? ?Social History:  reports that she has quit smoking. She has never used smokeless tobacco. She reports current alcohol use. She reports that she does not use drugs. ? ?No Known Allergies ?Family History  ?Problem Relation Age of Onset  ? Hypertension Mother   ? Hypertension Father   ? Depression Father   ? Bladder Cancer Neg Hx   ? Kidney cancer Neg Hx   ? Prostate cancer Neg Hx    ? ?Family history: Family history reviewed and not pertinent. ? ?Prior to Admission medications   ?Medication Sig Start Date End Date Taking? Authorizing Provider  ?aspirin EC 81 MG tablet Take by mouth. Take 1 tablet 3 times a week on Monday, Wednesday and Friday.   Yes [provider]  ?b complex vitamins capsule Take 1 capsule by mouth daily.   Yes [provider]  ?buPROPion (WELLBUTRIN SR) 150 MG 12 hr tablet Take 1 tablet by mouth 2 (two) times daily. 10/16/20  Yes [provider]  ?ciprofloxacin (CIPRO) 250 MG tablet Take 250 mg by mouth in the morning and at bedtime. 09/08/21 09/15/21 Yes [provider]  ?citalopram (CELEXA) 20 MG tablet Take 20 mg by mouth daily.   Yes [provider]  ?clopidogrel (PLAVIX) 75 MG tablet Take 1 tablet by mouth daily. 10/16/20  Yes [provider]  ?CRANBERRY PO Take 252 mg by mouth every evening.   Yes [provider]  ?furosemide (LASIX) 40 MG tablet Take 1 tablet (40 mg total) by mouth daily. 05/23/21 09/11/21 Yes Mercy Riding, MD  ?losartan (COZAAR) 50  MG tablet Take 1 tablet (50 mg total) by mouth daily. 05/23/21  Yes Mercy Riding, MD  ?memantine (NAMENDA) 10 MG tablet Take 1 tablet by mouth 2 (two) times daily. 10/14/20  Yes [provider]  ?Multiple Vitamins-Minerals (MULTIVITAMIN WOMEN) TABS Take 1 tablet by mouth daily.   Yes [provider]  ?Multiple Vitamins-Minerals (PRESERVISION AREDS) CAPS Take 2 capsules by mouth every evening.   Yes [provider]  ?pantoprazole (PROTONIX) 20 MG tablet Take 20 mg by mouth daily. 03/28/21  Yes [provider]  ?Probiotic Product (PROBIOTIC PO) Take 1 capsule by mouth daily.   Yes [provider]  ?albuterol (VENTOLIN HFA) 108 (90 Base) MCG/ACT inhaler Inhale 2 puffs into the lungs every 6 (six) hours as needed for wheezing or shortness of breath. 05/22/21   Mercy Riding, MD  ?gentamicin (GARAMYCIN) 0.3 % ophthalmic  solution Place 1 drop into both eyes every 4 (four) hours. 08/31/21   [provider]  ?traMADol (ULTRAM) 50 MG tablet Take 50 mg by mouth 2 (two) times daily as needed. 07/21/21   [provider]

## 2021-09-11 NOTE — ED Notes (Signed)
Pt taken for scans 

## 2021-09-11 NOTE — ED Triage Notes (Signed)
Pt arrives via EMS from home after she has had 3 falls in the last 24 hours- pt was diagnosed with a UTI last Friday and was put on cipro, however pt's daughter feels like AMS has gotten worse- pt only oriented to self and unable to accurately answer medical history questions ?

## 2021-09-11 NOTE — Assessment & Plan Note (Addendum)
Continue memantine 10 mg twice daily p.o. ?

## 2021-09-11 NOTE — ED Provider Notes (Signed)
? ?[facility] ?Provider Note ? ? ? Event Date/Time  ? First MD Initiated Contact with Patient 09/11/21 [phone]   ?  (approximate) ? ? ?History  ? ?Chief Complaint ?Altered Mental Status ? ? ?HPI ? ?Pamela Wolfe is a 88 y.o. female with a past medical history of hypertension, stroke, GERD, CHF, and dementia who presents to the ED for altered mental status.  History is limited due to patient's current confusion and disorientation.  When asked why she is here, patient only mumbles.  Per EMS, patient was recently diagnosed with a UTI and has been taking Cipro for the past 2 days.  Daughter is concerned that patient has had multiple falls it is more disoriented than usual.  Patient currently denies any pain and states she feels well.  It is unclear what her baseline mental status is ?  ? ? ?Physical Exam  ? ?Triage Vital Signs: ?ED Triage Vitals  ?Enc Vitals Group  ?   BP 09/11/21 0916 (!) 157/85  ?   Pulse Rate 09/11/21 0916 86  ?   Resp 09/11/21 0916 18  ?   Temp 09/11/21 0917 98.3 ?F (36.8 ?C)  ?   Temp Source 09/11/21 0917 Oral  ?   SpO2 09/11/21 0916 94 %  ?   Weight 09/11/21 0911 200 lb (90.7 kg)  ?   Height 09/11/21 0911 '5\' 2"'$  (1.575 m)  ?   Head Circumference --   ?   Peak Flow --   ?   Pain Score 09/11/21 0909 0  ?   Pain Loc --   ?   Pain Edu? --   ?   Excl. in [place]? --   ? ? ?Most recent vital signs: ?Vitals:  ? 09/11/21 0917 09/11/21 1100  ?BP:  (!) 136/92  ?Pulse:  90  ?Resp:  20  ?Temp: 98.3 ?F (36.8 ?C)   ?SpO2:  95%  ? ? ?Constitutional: Alert and oriented to person, but not place, time, or situation. ?Eyes: Conjunctivae are normal.  Pupils equal, round, and reactive to light bilaterally. ?Head: Atraumatic. ?Nose: No congestion/rhinnorhea. ?Mouth/Throat: Mucous membranes are moist.  ?Neck: No midline cervical spine tenderness to palpation. ?Cardiovascular: Normal rate, irregularly irregular rhythm. Grossly normal heart sounds.  2+ radial pulses bilaterally. ?Respiratory: Normal  respiratory effort.  No retractions. Lungs CTAB. ?Gastrointestinal: Soft and nontender. No distention. ?Musculoskeletal: No lower extremity tenderness nor edema.  ?Neurologic: Mumbling speech that is incoherent at times. No gross focal neurologic deficits are appreciated. ? ? ? ?ED Results / Procedures / Treatments  ? ?Labs ?(all labs ordered are listed, but only abnormal results are displayed) ?Labs Reviewed  ?COMPREHENSIVE METABOLIC PANEL - Abnormal; Notable for the following components:  ?    Result Value  ? Potassium 2.8 (*)   ? Glucose, Bld 127 (*)   ? All other components within normal limits  ?URINALYSIS, ROUTINE W REFLEX MICROSCOPIC - Abnormal; Notable for the following components:  ? Color, Urine YELLOW (*)   ? APPearance HAZY (*)   ? Ketones, ur 20 (*)   ? All other components within normal limits  ?TROPONIN I (HIGH SENSITIVITY) - Abnormal; Notable for the following components:  ? Troponin I (High Sensitivity) 19 (*)   ? All other components within normal limits  ?RESP PANEL BY RT-PCR (FLU A&B, COVID) [id number]  ?CULTURE, BLOOD (ROUTINE X 2)  ?CULTURE, BLOOD (ROUTINE X 2)  ?URINE CULTURE  ?CBC WITH DIFFERENTIAL/PLATELET  ?LACTIC ACID, PLASMA  ?LACTIC ACID,  PLASMA  ?PHOSPHORUS  ?MAGNESIUM  ?TSH  ?VITAMIN B12  ?TROPONIN I (HIGH SENSITIVITY)  ? ? ? ?EKG ? ?ED ECG REPORT ?Tempie Hoist, the attending physician, personally viewed and interpreted this ECG. ? ? Date: 09/11/2021 ? EKG Time: 9:28 ? Rate: 90 ? Rhythm: atrial fibrillation ? Axis: LAD ? Intervals:right bundle branch block and left anterior fascicular block ? ST&T Change: Nonspecific T wave changes ? ?RADIOLOGY ?CT head reviewed and interpreted by me with no hemorrhage or midline shift.  Chest x-ray reviewed and interpreted by me with no infiltrate, edema, or effusion. ? ?PROCEDURES: ? ?Critical Care performed: No ? ?Procedures ? ? ?MEDICATIONS ORDERED IN ED: ?Medications  ?aspirin EC tablet 81 mg (has no administration in time range)  ?acetaminophen  (TYLENOL) tablet 1,000 mg (has no administration in time range)  ?  Or  ?acetaminophen (TYLENOL) suppository 650 mg (has no administration in time range)  ?ondansetron (ZOFRAN) tablet 4 mg (has no administration in time range)  ?  Or  ?ondansetron (ZOFRAN) injection 4 mg (has no administration in time range)  ?enoxaparin (LOVENOX) injection 45 mg (has no administration in time range)  ?polyethylene glycol (MIRALAX / GLYCOLAX) packet 17 g (has no administration in time range)  ?cefTRIAXone (ROCEPHIN) 1 g in sodium chloride 0.9 % 100 mL IVPB (has no administration in time range)  ?cefTRIAXone (ROCEPHIN) 1 g in sodium chloride 0.9 % 100 mL IVPB (0 g Intravenous Stopped 09/11/21 1200)  ? ? ? ?IMPRESSION / MDM / ASSESSMENT AND PLAN / ED COURSE  ?I reviewed the triage vital signs and the nursing notes. ?             ?               ? ?88 y.o. female with past medical history of hypertension, stroke, GERD, dementia, and CHF who presents to the ED for altered mental status and recently diagnosed with UTI earlier this week. ? ?Differential diagnosis includes, but is not limited to, sepsis, UTI, pneumonia, dehydration, electrolyte abnormality, intracranial process, cervical spine injury, arrhythmia. ? ?Patient nontoxic-appearing and in no acute distress, vital signs are unremarkable and patient is afebrile, no findings to suggest sepsis at this time.  Symptoms could be related to UTI and I reviewed culture data from earlier this week where urine that was pansensitive.  She additionally apparently tested positive for COVID-19 on a test 2 weeks ago, although does not appear in any respiratory distress at this time and is maintaining O2 sats on room air.  With her multiple falls, will check CT head and cervical spine, no evidence of traumatic injury to her trunk or extremities.  EKG shows irregular rhythm consistent with atrial fibrillation, although rate is controlled and no ischemic findings noted.  She is at significant risk  for stroke with potential new onset atrial fibrillation.  We will give dose of Rocephin for UTI, attempt to recollect urine sample. ? ?CT head shows no evidence of intracranial process, CT cervical spine is also unremarkable.  There was some question of developing periapical abscess seen on CT imaging, however clinically I do not see any evidence of this or any signs of other dental infection.  She has no tenderness or pain whatsoever along her gumline, no erythema or edema noted.  Given significant change in her mental status, case discussed with hospitalist for admission, would also consider stroke given her jumbled speech pattern and new onset atrial fibrillation. ? ?  ? ? ?FINAL CLINICAL IMPRESSION(S) /  ED DIAGNOSES  ? ?Final diagnoses:  ?Altered mental status, unspecified altered mental status type  ?Lower urinary tract infectious disease  ?Atrial fibrillation, unspecified type ([place])  ? ? ? ?Rx / DC Orders  ? ?ED Discharge Orders   ? ? None  ? ?  ? ? ? ?Note:  This document was prepared using Dragon voice recognition software and may include unintentional dictation errors. ?  ?Blake Divine, MD ?09/11/21 1208 ? ?

## 2021-09-11 NOTE — Assessment & Plan Note (Addendum)
Continue losartan 50 mg daily, furosemide 40 mg p.o. daily ?

## 2021-09-11 NOTE — Evaluation (Signed)
Physical Therapy Evaluation ?Patient Details ?Name: Pamela Wolfe ?MRN: [id number] ?DOB: 03-02-1934 ?Today's Date: 09/11/2021 ? ?History of Present Illness ? presented to ER secondary to progressive weakness, difficulty with speech, AMS, recurrent falls; admitted for medical work-up of AMS (differentials include UTI, CVA per notes), workup ongoing.  ?Clinical Impression ? Patient resting in bed upon arrival to room; daughter at bedside.  Generally lethargic, but alertness does improve with stimulation and engagement by therapist.  Speech very garbled and largely unintelligible; unable to effectively make wants/needs known.  Does follow very simple, 1-step commands with increased time for processing; some degree of perseveration noted throughout session (verbal and motor).  Globally weak and deconditioned throughout all extremities; limited R shoulder elevation (daughter endorses history of dislocation), otherwise, no focal weakness appreciated.  Do not continuous twitching/myoclonic jerking throughout all extremities at rest; does appear to diminish with focused attention to task.  Currently requiring mod assist for bed mobility; min assist for unsupported sitting balance (posterior lean with fatigue, divided attention).  Fair functional reach (approx 4-5" from immediate BOS) with fair trunk control and ability to reorient to midline.  Standing/gait deferred this date; will continue to assess and progress next session as medically appropriate. ?Would benefit from skilled PT to address above deficits and promote optimal return to PLOF.; recommend transition to STR upon discharge from acute hospitalization. ? ?   ? ?Recommendations for follow up therapy are one component of a multi-disciplinary discharge planning process, led by the attending physician.  Recommendations may be updated based on patient status, additional functional criteria and insurance authorization. ? ?Follow Up Recommendations Skilled nursing-short term  rehab (<3 hours/day) ? ?  ?Assistance Recommended at Discharge Frequent or constant Supervision/Assistance  ?Patient can return home with the following ? A lot of help with walking and/or transfers;A lot of help with bathing/dressing/bathroom ? ?  ?Equipment Recommendations    ?Recommendations for Other Services ?    ?  ?Functional Status Assessment Patient has had a recent decline in their functional status and demonstrates the ability to make significant improvements in function in a reasonable and predictable amount of time.  ? ?  ?Precautions / Restrictions Precautions ?Precautions: Fall ?Restrictions ?Weight Bearing Restrictions: No  ? ?  ? ?Mobility ? Bed Mobility ?Overal bed mobility: Needs Assistance ?Bed Mobility: Supine to Sit ?  ?  ?Supine to sit: Min assist, Mod assist ?  ?  ?General bed mobility comments: hand-over-hand for LE management, assist for truncal elevation; unsupported sitting with min assist for posterior lean ?  ? ?Transfers ?  ?  ?  ?  ?  ?  ?  ?  ?  ?General transfer comment: deferred this date ?  ? ?Ambulation/Gait ?  ?  ?  ?  ?  ?  ?  ?General Gait Details: deferred this date ? ?Stairs ?  ?  ?  ?  ?  ? ?Wheelchair Mobility ?  ? ?Modified Rankin (Stroke Patients Only) ?  ? ?  ? ?Balance Overall balance assessment: Needs assistance ?Sitting-balance support: No upper extremity supported, Feet supported ?Sitting balance-Leahy Scale: Fair ?Sitting balance - Comments: posterior lean, worsens with fatigue/divided attention ?Postural control: Posterior lean ?  ?  ?  ?  ?  ?  ?  ?  ?  ?  ?  ?  ?  ?  ?   ? ? ? ?Pertinent Vitals/Pain Pain Assessment ?Pain Assessment: No/denies pain  ? ? ?Home Living Family/patient expects to be discharged to:: Private  residence ?Living Arrangements: Children ?Available Help at Discharge: Family;Available PRN/intermittently ?Type of Home: House ?Home Access: Stairs to enter ?Entrance Stairs-Rails: Can reach both ?Entrance Stairs-Number of Steps: 3 ?  ?Home Layout:  One level ?Home Equipment: Rollator (4 wheels);Standard Spira;Shower seat ?Additional Comments: Pt lives with daughter; goes to "day school" M-F from 9-3:30.  ?  ?Prior Function Prior Level of Function : Independent/Modified Independent;History of Falls (last six months) ?  ?  ?  ?  ?  ?  ?Mobility Comments: Recent use of SW for mobility needs; daughter endorses progressive increase in level of assist required over recent 2-3 weeks ?ADLs Comments: Independent with ADLs including dressing, toileting, and showering. Does not use shower chair. Assist with IADLs. ?  ? ? ?Hand Dominance  ?   ? ?  ?Extremity/Trunk Assessment  ? Upper Extremity Assessment ?Upper Extremity Assessment: Generalized weakness (act assist for R shoulder elevation (daughter endorses history of dislocation), elbow, wrist and hand at least 3+ to 4/5; L UE grossly 4/5 throughout. Resting myoclonic jerking noted; decreases with focused attention to task) ?  ? ?Lower Extremity Assessment ?Lower Extremity Assessment: Generalized weakness (grossly at least 3+/5 throughout.  Resting myoclonic jerking noted; decreases with focused attention to task) ?  ? ?   ?Communication  ? Communication: Expressive difficulties (generally garbled and largely unintelligible; very limited ability to answer questions)  ?Cognition Arousal/Alertness: Awake/alert ?Behavior During Therapy: Anxious ?Overall Cognitive Status: Impaired/Different from baseline ?  ?  ?  ?  ?  ?  ?  ?  ?  ?  ?  ?  ?  ?  ?  ?  ?General Comments: Unable to state name, location or situation; limited ability to verbally answer questions; does follow very simple, one-step commands (with some perseveration) with increased time for processing ?  ?  ? ?  ?General Comments   ? ?  ?Exercises Other Exercises ?Other Exercises: Object naming, 1/3; demonstrates use 1/3. Noted perseveration with task. ?Other Exercises: Unsupported sitting, participated with dynamic reaching task (L > R UE).  Demonstrates 4-5"  reach from immediate BOS; greater confidence with reaching L > R  ? ?Assessment/Plan  ?  ?PT Assessment Patient needs continued PT services  ?PT Problem List Decreased strength;Decreased range of motion;Decreased activity tolerance;Decreased balance;Decreased mobility;Decreased coordination;Decreased knowledge of use of DME;Decreased safety awareness;Decreased knowledge of precautions;Decreased cognition ? ?   ?  ?PT Treatment Interventions DME instruction;Gait training;Functional mobility training;Stair training;Therapeutic activities;Therapeutic exercise;Balance training;Patient/family education;Cognitive remediation   ? ?PT Goals (Current goals can be found in the Care Plan section)  ?Acute Rehab PT Goals ?Patient Stated Goal: agreeable to participation with session ?PT Goal Formulation: With patient/family ?Time For Goal Achievement: 09/25/21 ?Potential to Achieve Goals: Fair ? ?  ?Frequency Min 2X/week ?  ? ? ?Co-evaluation   ?  ?  ?  ?  ? ? ?  ?AM-PAC PT "6 Clicks" Mobility  ?Outcome Measure Help needed turning from your back to your side while in a flat bed without using bedrails?: A Little ?Help needed moving from lying on your back to sitting on the side of a flat bed without using bedrails?: A Lot ?Help needed moving to and from a bed to a chair (including a wheelchair)?: A Lot ?Help needed standing up from a chair using your arms (e.g., wheelchair or bedside chair)?: Total ?Help needed to walk in hospital room?: Total ?Help needed climbing 3-5 steps with a railing? : Total ?6 Click Score: 10 ? ?  ?End  of Session Equipment Utilized During Treatment: Gait belt ?Activity Tolerance: Patient tolerated treatment well ?Patient left: in chair;with call bell/phone within reach;with chair alarm set ?Nurse Communication: Mobility status ?PT Visit Diagnosis: Muscle weakness (generalized) (M62.81);Difficulty in walking, not elsewhere classified (R26.2);Other symptoms and signs involving the nervous system (R29.898) ?   ? ?Time: 5831-6742 ?PT Time Calculation (min) (ACUTE ONLY): 29 min ? ? ?Charges:   PT Evaluation ?$PT Eval Moderate Complexity: 1 Mod ?PT Treatments ?$Therapeutic Activity: 8-22 mins ?  ?   ? ?Cyril Mourning

## 2021-09-11 NOTE — ED Notes (Signed)
Attempted to call patient daughter, left message for Geni Bers (daughter) ?

## 2021-09-12 ENCOUNTER — Observation Stay (HOSPITAL_COMMUNITY)
Admit: 2021-09-12 | Discharge: 2021-09-12 | Disposition: A | Discharge status: Home / Self Care | Payer: Medicare Other | Attending: Internal Medicine | Admitting: Internal Medicine

## 2021-09-12 ENCOUNTER — Observation Stay: Payer: Medicare Other

## 2021-09-12 DIAGNOSIS — Z7982 Long term (current) use of aspirin: Secondary | ICD-10-CM | POA: Diagnosis not present

## 2021-09-12 DIAGNOSIS — Z8616 Personal history of COVID-19: Secondary | ICD-10-CM | POA: Diagnosis not present

## 2021-09-12 DIAGNOSIS — I6381 Other cerebral infarction due to occlusion or stenosis of small artery: Secondary | ICD-10-CM | POA: Diagnosis present

## 2021-09-12 DIAGNOSIS — F03918 Unspecified dementia, unspecified severity, with other behavioral disturbance: Secondary | ICD-10-CM | POA: Diagnosis present

## 2021-09-12 DIAGNOSIS — Z9841 Cataract extraction status, right eye: Secondary | ICD-10-CM | POA: Diagnosis not present

## 2021-09-12 DIAGNOSIS — E876 Hypokalemia: Secondary | ICD-10-CM | POA: Diagnosis present

## 2021-09-12 DIAGNOSIS — I480 Paroxysmal atrial fibrillation: Secondary | ICD-10-CM | POA: Diagnosis present

## 2021-09-12 DIAGNOSIS — I639 Cerebral infarction, unspecified: Secondary | ICD-10-CM | POA: Diagnosis not present

## 2021-09-12 DIAGNOSIS — I4891 Unspecified atrial fibrillation: Secondary | ICD-10-CM | POA: Diagnosis not present

## 2021-09-12 DIAGNOSIS — Z9842 Cataract extraction status, left eye: Secondary | ICD-10-CM | POA: Diagnosis not present

## 2021-09-12 DIAGNOSIS — N390 Urinary tract infection, site not specified: Secondary | ICD-10-CM | POA: Diagnosis present

## 2021-09-12 DIAGNOSIS — G4730 Sleep apnea, unspecified: Secondary | ICD-10-CM | POA: Diagnosis present

## 2021-09-12 DIAGNOSIS — R413 Other amnesia: Secondary | ICD-10-CM

## 2021-09-12 DIAGNOSIS — R400 Somnolence: Secondary | ICD-10-CM | POA: Diagnosis present

## 2021-09-12 DIAGNOSIS — I110 Hypertensive heart disease with heart failure: Secondary | ICD-10-CM | POA: Diagnosis present

## 2021-09-12 DIAGNOSIS — Z7902 Long term (current) use of antithrombotics/antiplatelets: Secondary | ICD-10-CM | POA: Diagnosis not present

## 2021-09-12 DIAGNOSIS — I472 Ventricular tachycardia, unspecified: Secondary | ICD-10-CM | POA: Diagnosis not present

## 2021-09-12 DIAGNOSIS — F0393 Unspecified dementia, unspecified severity, with mood disturbance: Secondary | ICD-10-CM | POA: Diagnosis present

## 2021-09-12 DIAGNOSIS — R778 Other specified abnormalities of plasma proteins: Secondary | ICD-10-CM

## 2021-09-12 DIAGNOSIS — R404 Transient alteration of awareness: Secondary | ICD-10-CM

## 2021-09-12 DIAGNOSIS — Y92009 Unspecified place in unspecified non-institutional (private) residence as the place of occurrence of the external cause: Secondary | ICD-10-CM | POA: Diagnosis not present

## 2021-09-12 DIAGNOSIS — G4700 Insomnia, unspecified: Secondary | ICD-10-CM

## 2021-09-12 DIAGNOSIS — Z87891 Personal history of nicotine dependence: Secondary | ICD-10-CM | POA: Diagnosis not present

## 2021-09-12 DIAGNOSIS — Z8249 Family history of ischemic heart disease and other diseases of the circulatory system: Secondary | ICD-10-CM | POA: Diagnosis not present

## 2021-09-12 DIAGNOSIS — K219 Gastro-esophageal reflux disease without esophagitis: Secondary | ICD-10-CM | POA: Diagnosis present

## 2021-09-12 DIAGNOSIS — W06XXXA Fall from bed, initial encounter: Secondary | ICD-10-CM | POA: Diagnosis present

## 2021-09-12 DIAGNOSIS — I4729 Other ventricular tachycardia: Secondary | ICD-10-CM | POA: Diagnosis not present

## 2021-09-12 DIAGNOSIS — F32A Depression, unspecified: Secondary | ICD-10-CM | POA: Diagnosis present

## 2021-09-12 DIAGNOSIS — Z20822 Contact with and (suspected) exposure to covid-19: Secondary | ICD-10-CM | POA: Diagnosis present

## 2021-09-12 DIAGNOSIS — R29706 NIHSS score 6: Secondary | ICD-10-CM | POA: Diagnosis present

## 2021-09-12 DIAGNOSIS — Z8673 Personal history of transient ischemic attack (TIA), and cerebral infarction without residual deficits: Secondary | ICD-10-CM | POA: Diagnosis not present

## 2021-09-12 DIAGNOSIS — Z66 Do not resuscitate: Secondary | ICD-10-CM | POA: Diagnosis present

## 2021-09-12 DIAGNOSIS — I5032 Chronic diastolic (congestive) heart failure: Secondary | ICD-10-CM | POA: Diagnosis present

## 2021-09-12 LAB — CBC
HCT: 35.9 % — ABNORMAL LOW (ref 36.0–46.0)
Hemoglobin: 12.3 g/dL (ref 12.0–15.0)
MCH: 31 pg (ref 26.0–34.0)
MCHC: 34.3 g/dL (ref 30.0–36.0)
MCV: 90.4 fL (ref 80.0–100.0)
Platelets: 234 10*3/uL (ref 150–400)
RBC: 3.97 MIL/uL (ref 3.87–5.11)
RDW: 13.5 % (ref 11.5–15.5)
WBC: 7.4 10*3/uL (ref 4.0–10.5)
nRBC: 0 % (ref 0.0–0.2)

## 2021-09-12 LAB — LIPID PANEL
Cholesterol: 185 mg/dL (ref 0–200)
HDL: 49 mg/dL (ref >40)
LDL Cholesterol: 120 mg/dL — ABNORMAL HIGH (ref 0–99)
Total CHOL/HDL Ratio: 3.8 RATIO
Triglycerides: 81 mg/dL (ref <150)
VLDL: 16 mg/dL (ref 0–40)

## 2021-09-12 LAB — BASIC METABOLIC PANEL
Anion gap: 3 — ABNORMAL LOW (ref 5–15)
BUN: 14 mg/dL (ref 8–23)
CO2: 29 mmol/L (ref 22–32)
Calcium: 8.9 mg/dL (ref 8.9–10.3)
Chloride: 107 mmol/L (ref 98–111)
Creatinine, Ser: 0.83 mg/dL (ref 0.44–1.00)
GFR, Estimated: >60 mL/min (ref >60)
Glucose, Bld: 109 mg/dL — ABNORMAL HIGH (ref 70–99)
Potassium: 3 mmol/L — ABNORMAL LOW (ref 3.5–5.1)
Sodium: 139 mmol/L (ref 135–145)

## 2021-09-12 LAB — ECHOCARDIOGRAM COMPLETE
Area-P 1/2: 2.23 cm2
Height: 62 in
S' Lateral: 2.8 cm
Weight: 3200 oz

## 2021-09-12 LAB — HEMOGLOBIN A1C
Hgb A1c MFr Bld: 5.3 % (ref 4.8–5.6)
Mean Plasma Glucose: 105.41 mg/dL

## 2021-09-12 MED ORDER — IOHEXOL 350 MG/ML SOLN
75.0000 mL | Freq: Once | INTRAVENOUS | Status: AC | PRN
  Administered 2021-09-12: 75 mL via INTRAVENOUS

## 2021-09-12 MED ORDER — POTASSIUM CHLORIDE CRYS ER 20 MEQ PO TBCR
40.0000 meq | EXTENDED_RELEASE_TABLET | Freq: Once | ORAL | Status: AC
  Administered 2021-09-12: 40 meq via ORAL
  Filled 2021-09-12: qty 2

## 2021-09-12 MED ORDER — POTASSIUM CHLORIDE 10 MEQ/100ML IV SOLN
10.0000 meq | INTRAVENOUS | Status: AC
  Administered 2021-09-12 (×2): 10 meq via INTRAVENOUS
  Filled 2021-09-12 (×2): qty 100

## 2021-09-12 MED ORDER — ATORVASTATIN CALCIUM 20 MG PO TABS
40.0000 mg | ORAL_TABLET | Freq: Every day | ORAL | Status: DC
Start: 2021-09-12 — End: 2021-09-14
  Administered 2021-09-12 – 2021-09-13 (×2): 40 mg via ORAL
  Filled 2021-09-12 (×2): qty 2

## 2021-09-12 MED ORDER — ASPIRIN EC 81 MG PO TBEC
81.0000 mg | DELAYED_RELEASE_TABLET | Freq: Every day | ORAL | Status: DC
Start: 2021-09-12 — End: 2021-09-14
  Administered 2021-09-12 – 2021-09-14 (×3): 81 mg via ORAL
  Filled 2021-09-12 (×3): qty 1

## 2021-09-12 NOTE — TOC Initial Note (Signed)
Transition of Care (TOC) - Initial/Assessment Note  ? ? ?Patient Details  ?Name: Pamela Wolfe ?MRN: [id number] ?Date of Birth: Jul 20, 1933 ? ?Transition of Care (TOC) CM/SW Contact:    ?Alberteen Sam, LCSW ?Phone Number: ?09/12/2021, 8:48 AM ? ?Clinical Narrative:                 ? ?CSW spoke with patient's daughter Devyn regarding SNF recs from PT, she reports being in agreement with this plan with no preference of facility. Agreeable for CSW to send referrals to local facilities for bed offers.  ? ?CSW has sent out referrals pending bed offers a this time.  ? ?Expected Discharge Plan: [place] ?Barriers to Discharge: Continued Medical Work up ? ? ?Patient Goals and CMS Choice ?Patient states their goals for this hospitalization and ongoing recovery are:: to go home ?CMS Medicare.gov Compare Post Acute Care list provided to:: Patient Represenative (must comment) (daughter) ?Choice offered to / list presented to : Adult Children ? ?Expected Discharge Plan and Services ?Expected Discharge Plan: [place] ?  ?  ?  ?Living arrangements for the past 2 months: [place] ?                ?  ?  ?  ?  ?  ?  ?  ?  ?  ?  ? ?Prior Living Arrangements/Services ?Living arrangements for the past 2 months: [place] ?Lives with:: Self ?  ?       ?  ?  ?  ?  ? ?Activities of Daily Living ?Home Assistive Devices/Equipment: Ashraf (specify type) ?ADL Screening (condition at time of admission) ?Patient's cognitive ability adequate to safely complete daily activities?: Yes ?Is the patient deaf or have difficulty hearing?: Yes ?Does the patient have difficulty seeing, even when wearing glasses/contacts?: No ?Does the patient have difficulty concentrating, remembering, or making decisions?: Yes ?Patient able to express need for assistance with ADLs?: Yes ?Does the patient have difficulty dressing or bathing?: Yes ?Independently performs ADLs?: No ?Does the patient have difficulty walking or  climbing stairs?: Yes ?Weakness of Legs: Both ?Weakness of Arms/Hands: None ? ?Permission Sought/Granted ?  ?  ?   ?   ?   ?   ? ?Emotional Assessment ?  ?  ?  ?Orientation: : Oriented to Self ?  ?  ? ?Admission diagnosis:  Lower urinary tract infectious disease [N39.0] ?Altered mental status [R41.82] ?Altered mental status, unspecified altered mental status type [R41.82] ?Atrial fibrillation, unspecified type ([place]) [I48.91] ?Patient Active Problem List  ? Diagnosis Date Noted  ? Altered mental status 09/11/2021  ? Sepsis secondary to UTI ([place]) 09/11/2021  ? UTI (urinary tract infection) 09/11/2021  ? Hypokalemia 09/11/2021  ? Insomnia 09/11/2021  ? Paroxysmal atrial fibrillation ([place]) 09/11/2021  ? History of COVID-19 09/11/2021  ? Elevated troponin 09/11/2021  ? Acute heart failure ([place]) 05/20/2021  ? Depression   ? Flash pulmonary edema ([facility])   ? Acute respiratory failure with hypoxia ([place])   ? Tremor 02/06/2017  ? Essential hypertension 01/26/2017  ? Recurrent major depressive disorder, in full remission ([place]) 01/26/2017  ? History of stroke 12/08/2016  ? Memory loss 12/08/2016  ? Neck pain 12/08/2016  ? ?PCP:  Marinda Elk, MD ?Pharmacy:   ?[id number]?Phone: [phone]: [phone]? ? ? ? ?Social Determinants of Health (SDOH) Interventions ?  ? ?Readmission  Risk Interventions ? ?  05/22/2021  ? 10:38 AM  ?Readmission Risk Prevention Plan  ?Post Dischage Appt Complete  ?Medication Screening Complete  ?Transportation Screening Complete  ? ? ? ?

## 2021-09-12 NOTE — Evaluation (Signed)
Clinical/Bedside Swallow Evaluation ?Patient Details  ?Name: Pamela Wolfe ?MRN: [id number] ?Date of Birth: 31-Oct-1933 ? ?Today's Date: 09/12/2021 ?Time: SLP Start Time (ACUTE ONLY): 1225 SLP Stop Time (ACUTE ONLY): 5809 ?SLP Time Calculation (min) (ACUTE ONLY): 10 min ? ?Past Medical History:  ?Past Medical History:  ?Diagnosis Date  ? Depression   ? Essential hypertension 01/26/2017  ? GERD (gastroesophageal reflux disease)   ? History of stroke 12/08/2016  ? Loss of memory 12/08/2016  ? Macular degeneration   ? Neck pain 12/08/2016  ? Recurrent major depressive disorder, in full remission ([place]) 01/26/2017  ? Sleep apnea   ? Tremor 02/06/2017  ? ?Past Surgical History:  ?Past Surgical History:  ?Procedure Laterality Date  ? ABDOMINAL HYSTERECTOMY  1975  ? BUNIONECTOMY  2016  ? CATARACT EXTRACTION, BILATERAL    ? CHOLECYSTECTOMY    ? SALIVARY GLAND SURGERY    ? ?HPI:  ?Per H&P "Ms. Pamela Wolfe is a 88 year old female with history of hypertension, CVA, GERD, heart failure preserved ejection fraction, heart failure grade 3 diastolic dysfunction, recent diagnosis of UTI, memory decline concerning for dementia, who presents emergency department for chief concerns of frequent falls in the last 24 hours.    Initial vitals in the emergency department showed temperature of 98.3, respiration rate of 18, heart rate of 86, blood pressure 157/85, SPO2 of 94% on room air.    Serum sodium is 134, potassium 2.8, chloride 98, bicarb 29, BUN of 14, serum creatinine of 0.72, GFR greater than 60, nonfasting blood glucose 127, WBC 9.3, hemoglobin 12.9, platelets of 260.    COVID/influenza A/influenza B PCR were negative.  High sensitive troponin was 19.  Lactic acid was 1.0.    UA in the emergency department collected was negative for leukocytes or nitrates.    Blood cultures x2 have been collected and are in fact, with repeat urine culture collected and is in process.     ED treatment: ceftriaxone 1 g IV.     At bedside, she was able to tell me her  name. She was not able to tell me her age, location, or current year. She was not able to identify her daughter at bedside.      She had covid 19 about two weeks ago.      At baseline, daughter states that she knows daughter, feeds herself and goes to senior daycare. She has been urinating in her depends and pads at daycare.     She normally wakes up early in the morning. She was found on the floor at 7:40 am when daughter woke up in the morning and looked at the camera. Per camera, at 3:40 AM, she slid off the bed and layed on the floor until 7:40 am, prompting daughter to call ems.      Social history: She lives with her daughter, Pamela Wolfe. She is a former tobacco user, quitting at age 77. She does not drink etoh anymore. She is retired and formerly worked as Designer, multimedia at [employer].     Vaccination history: She is vaccinated for covid 19 and influenza.      ROS: Unable to complete due to altered mental status and baseline demnentia"  ?  ?Assessment / Plan / Recommendation  ?Clinical Impression ? Pt seen for clinical swallowing evaluation. Pt alert and cooperative. Aphasia (vs confusion) noted. HOH. Daughter at bedside. Daughter denies any observed difficulty swallowing PTA and during this admission - including sips of thin liquids via straw. ? ?Pt observed  with trials of solid, pureed, and thin liquids. Pt demonstrated an intact oral swallow. Pharyngeal swallow appeared [facility] per clinical assessment with no observed or subtle s/sx pharyngeal dysphagia, changes to respiration, or changes to vocal quality.  ? ?Recommend a regular diet with thin liquids with safe swallowing strategies/aspiration precautions as outlined below including set up for meals.  ? ?No f/u SLP services warranted for swallow function.  ? ?Pt, pt's daughter, and RN made aware of results, recommendations, and SLP POC.  ? ?SLP Visit Diagnosis: Dysphagia, unspecified (R13.10) ?   ?Aspiration Risk ? Mild aspiration risk  ?  ?Diet  Recommendation Regular;Thin liquid  ? ?Medication Administration: Whole meds with puree (vs crushed with puree) ?Supervision: Patient able to self feed;Intermittent supervision to cue for compensatory strategies ?Compensations: Minimize environmental distractions;Slow rate;Small sips/bites ?Postural Changes: Seated upright at 90 degrees;Remain upright for at least 30 minutes after po intake  ?  ?Other  Recommendations Oral Care Recommendations: Oral care BID;Staff/trained caregiver to provide oral care (set up assistance)   ? ?Recommendations for follow up therapy are one component of a multi-disciplinary discharge planning process, led by the attending physician.  Recommendations may be updated based on patient status, additional functional criteria and insurance authorization. ? ?Follow up Recommendations Home health SLP  ? ? ?  ?Assistance Recommended at Discharge Frequent or constant Supervision/Assistance  ?Functional Status Assessment Patient has not had a recent decline in their functional status (for swallowing)  ?   ?   ? ?Prognosis Prognosis for Safe Diet Advancement: Good ?Barriers to Reach Goals: Cognitive deficits;Language deficits  ? ?  ? ?Swallow Study   ?General Date of Onset: 09/11/21 ?HPI: Per H&P "Ms. Pamela Wolfe is a 88 year old female with history of hypertension, CVA, GERD, heart failure preserved ejection fraction, heart failure grade 3 diastolic dysfunction, recent diagnosis of UTI, memory decline concerning for dementia, who presents emergency department for chief concerns of frequent falls in the last 24 hours.    Initial vitals in the emergency department showed temperature of 98.3, respiration rate of 18, heart rate of 86, blood pressure 157/85, SPO2 of 94% on room air.    Serum sodium is 134, potassium 2.8, chloride 98, bicarb 29, BUN of 14, serum creatinine of 0.72, GFR greater than 60, nonfasting blood glucose 127, WBC 9.3, hemoglobin 12.9, platelets of 260.    COVID/influenza  A/influenza B PCR were negative.  High sensitive troponin was 19.  Lactic acid was 1.0.    UA in the emergency department collected was negative for leukocytes or nitrates.    Blood cultures x2 have been collected and are in fact, with repeat urine culture collected and is in process.     ED treatment: ceftriaxone 1 g IV.     At bedside, she was able to tell me her name. She was not able to tell me her age, location, or current year. She was not able to identify her daughter at bedside.      She had covid 19 about two weeks ago.      At baseline, daughter states that she knows daughter, feeds herself and goes to senior daycare. She has been urinating in her depends and pads at daycare.     She normally wakes up early in the morning. She was found on the floor at 7:40 am when daughter woke up in the morning and looked at the camera. Per camera, at 3:40 AM, she slid off the bed and layed on the floor until 7:40  am, prompting daughter to call ems.      Social history: She lives with her daughter, Pamela Wolfe. She is a former tobacco user, quitting at age 29. She does not drink etoh anymore. She is retired and formerly worked as Designer, multimedia at [employer].     Vaccination history: She is vaccinated for covid 19 and influenza.      ROS: Unable to complete due to altered mental status and baseline demnentia" ?Type of Study: Bedside Swallow Evaluation ?Diet Prior to this Study: Nectar-thick liquids;Dysphagia 3 (soft) ?Temperature Spikes Noted: Yes ?Respiratory Status: Room air ?History of Recent Intubation: No ?Behavior/Cognition: Alert;Cooperative;Pleasant mood;Confused;Requires cueing;Distractible ?Oral Cavity Assessment: Within Functional Limits ?Oral Care Completed by SLP: Recent completion by staff ?Oral Cavity - Dentition: Adequate natural dentition ?Vision: Functional for self-feeding ?Self-Feeding Abilities: Able to feed self;Needs set up ?Patient Positioning: Upright in bed ?Baseline Vocal Quality:  Normal ?Volitional Cough: Strong ?Volitional Swallow: Able to elicit  ?  ?Oral/Motor/Sensory Function Overall Oral Motor/Sensory Function: Within functional limits   ?Thin Liquid Thin Liquid: Within functional limits ?Presentation: [facility]

## 2021-09-12 NOTE — Progress Notes (Signed)
?Progress Note ? ? ?Patient: Pamela Wolfe VQX:[id number] DOB: 1933/11/23 DOA: 09/11/2021     0 ?DOS: the patient was seen and examined on 09/12/2021 ?  ?Brief hospital course: ?Pamela Wolfe is a 88 year old female with history of hypertension, CVA, GERD, memory loss.  Presented to the emergency room with difficulty speaking, falls.  By MRI she was found to have acute to subacute stroke of the right nasal cannula ? ? ? ?Assessment and Plan: ?* Acute ischemic stroke ([place]) ?The patient takes aspirin 3 times a week  and Plavix daily at home already.  Increased aspirin to daily dosing and continue Plavix.  We will get neurology consultation opinion about blood thinners.  Had brief episode of atrial fibrillation in the emergency room.  LDL 130 and started on Lipitor.  CT angio shows 45% blockage right ICA.  Echocardiogram pending. ? ?Paroxysmal atrial fibrillation ([place]) ?Brief atrial fibrillation in the emergency room.  Currently in normal sinus rhythm. ? ?UTI (urinary tract infection) ?Urinalysis negative.  Discontinue antibiotics. ? ?Elevated troponin ?Likely secondary to stroke. ? ?Insomnia ?Trazodone discontinued. Melatonin 5 mg nightly as needed for sleep ordered ? ?Hypokalemia ?We will give 2 potassium rounds today.  Patient placed on regular diet by speech therapy. ? ?Altered mental status ?Patient answering some questions today.  Likely secondary to stroke ? ?Depression ?Continue home bupropion 150 mg p.o. twice daily, citalopram 20 mg daily ? ?Memory loss ?Continue memantine 10 mg twice daily p.o. ? ?Essential hypertension ?Continue losartan 50 mg daily, furosemide 40 mg p.o. daily ? ? ? ? ?  ? ?Subjective: Patient brought him here to the hospital.  She does not recall what happened.  Daughter states about a week ago Friday she was not talking as well and difficult to understand.  She has had a few falls at home and was brought into the hospital for further evaluation.  On MRI found to have a acute to subacute  stroke ? ?Physical Exam: ?Vitals:  ? 09/12/21 0507 09/12/21 0907 09/12/21 0931 09/12/21 1140  ?BP: (!) 170/76 (!) 162/75 (!) 157/67 128/64  ?Pulse: 75 81 79 86  ?Resp: '18 18  18  '$ ?Temp: (!) 97.5 ?F (36.4 ?C) 97.7 ?F (36.5 ?C)  99.5 ?F (37.5 ?C)  ?TempSrc: Oral     ?SpO2: 93% 90%  91%  ?Weight:      ?Height:      ? ?Physical Exam ?HENT:  ?   Head: Normocephalic.  ?   Mouth/Throat:  ?   Pharynx: No oropharyngeal exudate.  ?Eyes:  ?   General: Lids are normal.  ?   Conjunctiva/sclera: Conjunctivae normal.  ?Cardiovascular:  ?   Rate and Rhythm: Normal rate and regular rhythm.  ?   Heart sounds: Normal heart sounds, S1 normal and S2 normal.  ?Pulmonary:  ?   Breath sounds: No decreased breath sounds, wheezing, rhonchi or rales.  ?Abdominal:  ?   Palpations: Abdomen is soft.  ?   Tenderness: There is no abdominal tenderness.  ?Musculoskeletal:  ?   Right lower leg: Swelling present.  ?   Left lower leg: Swelling present.  ?Skin: ?   General: Skin is warm.  ?   Findings: No rash.  ?Neurological:  ?   Mental Status: She is alert.  ?   Comments: Occasional some slurring of speech.  Able to straight leg raise bilaterally.  Able to raise arms up off the bed.  Able to flex and extend at the ankles.  ?  ?Data  Reviewed: ?Potassium 3.0, creatinine 0.83, LDL 120, hematocrit 35.9 ? ?Family Communication: Spoke with patient's daughter at the bedside ? ?Disposition: ?Status is: Inpatient ?Remains inpatient appropriate because: Diagnosed with acute to subacute stroke and will need rehab. ? ?Planned Discharge Destination: Rehab ? ? ?Author: ?Loletha Grayer, MD ?09/12/2021 12:42 PM ? ?For on call review [email or web address].  ?

## 2021-09-12 NOTE — Assessment & Plan Note (Addendum)
The patient takes aspirin 3 times a week  and Plavix daily at home already.  Increased aspirin to daily dosing and continue Plavix.  Neurology mentioned that we can consider Eliquis in a couple weeks as outpatient.  Had brief episode of atrial fibrillation in the emergency room.  LDL 130 and started on Lipitor.  CT angio shows 45% blockage right ICA.  Echocardiogram shows normal EF. ?

## 2021-09-12 NOTE — Evaluation (Signed)
Speech Language Pathology Evaluation ?Patient Details ?Name: Pamela Wolfe ?MRN: [id number] ?DOB: [birth year] ?Today's Date: 09/12/2021 ?Time: 7209-4709 ?SLP Time Calculation (min) (ACUTE ONLY): 30 min ? ?Problem List:  ?Patient Active Problem List  ? Diagnosis Date Noted  ? Acute ischemic stroke ([place]) 09/12/2021  ? Altered mental status 09/11/2021  ? Sepsis secondary to UTI ([place]) 09/11/2021  ? UTI (urinary tract infection) 09/11/2021  ? Hypokalemia 09/11/2021  ? Insomnia 09/11/2021  ? Paroxysmal atrial fibrillation ([place]) 09/11/2021  ? History of COVID-19 09/11/2021  ? Elevated troponin 09/11/2021  ? Acute heart failure ([place]) 05/20/2021  ? Depression   ? Flash pulmonary edema (HCC)   ? Acute respiratory failure with hypoxia ([place])   ? Tremor 02/06/2017  ? Essential hypertension 01/26/2017  ? Recurrent major depressive disorder, in full remission ([place]) 01/26/2017  ? Memory loss 12/08/2016  ? Neck pain 12/08/2016  ? ?Past Medical History:  ?Past Medical History:  ?Diagnosis Date  ? Depression   ? Essential hypertension 01/26/2017  ? GERD (gastroesophageal reflux disease)   ? History of stroke 12/08/2016  ? Loss of memory 12/08/2016  ? Macular degeneration   ? Neck pain 12/08/2016  ? Recurrent major depressive disorder, in full remission ([place]) 01/26/2017  ? Sleep apnea   ? Tremor 02/06/2017  ? ?Past Surgical History:  ?Past Surgical History:  ?Procedure Laterality Date  ? ABDOMINAL HYSTERECTOMY  1975  ? BUNIONECTOMY  2016  ? CATARACT EXTRACTION, BILATERAL    ? CHOLECYSTECTOMY    ? SALIVARY GLAND SURGERY    ? ?HPI:  ?Per H&P "Ms. Pamela Wolfe is a 88 year old female with history of hypertension, CVA, GERD, heart failure preserved ejection fraction, heart failure grade 3 diastolic dysfunction, recent diagnosis of UTI, memory decline concerning for dementia, who presents emergency department for chief concerns of frequent falls in the last 24 hours.    Initial vitals in the emergency department showed temperature of 98.3, respiration rate  of 18, heart rate of 86, blood pressure 157/85, SPO2 of 94% on room air.    Serum sodium is 134, potassium 2.8, chloride 98, bicarb 29, BUN of 14, serum creatinine of 0.72, GFR greater than 60, nonfasting blood glucose 127, WBC 9.3, hemoglobin 12.9, platelets of 260.    COVID/influenza A/influenza B PCR were negative.  High sensitive troponin was 19.  Lactic acid was 1.0.    UA in the emergency department collected was negative for leukocytes or nitrates.    Blood cultures x2 have been collected and are in fact, with repeat urine culture collected and is in process.     ED treatment: ceftriaxone 1 g IV.     At bedside, she was able to tell me her name. She was not able to tell me her age, location, or current year. She was not able to identify her daughter at bedside.      She had covid 19 about two weeks ago.      At baseline, daughter states that she knows daughter, feeds herself and goes to senior daycare. She has been urinating in her depends and pads at daycare.     She normally wakes up early in the morning. She was found on the floor at 7:40 am when daughter woke up in the morning and looked at the camera. Per camera, at 3:40 AM, she slid off the bed and layed on the floor until 7:40 am, prompting daughter to call ems.      Social history: She lives with her daughter, Pamela Bers  Wolfe. She is a former tobacco user, quitting at age 4. She does not drink etoh anymore. She is retired and formerly worked as Designer, multimedia at [employer].     Vaccination history: She is vaccinated for covid 19 and influenza.      ROS: Unable to complete due to altered mental status and baseline demnentia"  ? ?Assessment / Plan / Recommendation ?Clinical Impression ? Pt seen for cognitive-linguistic evaluation. Daughter at bedside and provided details re: PLOF. Per daughter, pt with hx of dementia with memory loss. Pt has 24 hour supervision PTA and receives assistance with iADLs. Pt resides with daughter. ? ?Assessment completed via  informal assessment. Pt HOH and wears BHAs.  Hearing status appeared to affect auditory comprehension. Pt alert with periods of lethargy. Oriented to self only. Pt with impaired auditory comprehension for complex information, reduced verbal expression with difficulty with conversational/spontaneous speech and confrontation naming. Verbal expression is notable for mild and intermittent articulatory imprecision, anomia, perseverative output, ?echolalia, and semantic paraphasias. Pt with increased difficulty repeating phrases/sentences of increased length. Suspect memory deficits playing a role in ability to repeat. Pt with reduced short term/functional memory and problem solving appreciated. Further diagnostic tx warranted to further assess pt's cognitive-communication deficits - ?aphasia vs cognitive-linguistic deficits vs combination. ? ?Daughter stated pt's speech is improved from admission, but pt is not at baseline.  ? ?Recommend continued SLP services acute/post-acute given the above.  ? ?SLP to continue to f/u while pt in house. ? ?Pt, pt's daughter, and RN made aware of results, recommendations, and SLP POC. Daughter verbalized understanding/agreement. ?full understanding by pt.  ?   ?SLP Assessment ? SLP Recommendation/Assessment: Patient needs continued [place] Pathology Services ?SLP Visit Diagnosis: Cognitive communication deficit (R41.841)  ?  ?Recommendations for follow up therapy are one component of a multi-disciplinary discharge planning process, led by the attending physician.  Recommendations may be updated based on patient status, additional functional criteria and insurance authorization. ?   ?Follow Up Recommendations ? Skilled nursing-short term rehab (<3 hours/day)  ?  ?Assistance Recommended at Discharge ? Frequent or constant Supervision/Assistance  ?Functional Status Assessment Patient has had a recent decline in their functional status and demonstrates the ability to make  significant improvements in function in a reasonable and predictable amount of time. (for swallowing)  ?Frequency and Duration min 2x/week  ?2 weeks ?  ?   ?SLP Evaluation ?Cognition ? Overall Cognitive Status: Impaired/Different from baseline ?Arousal/Alertness: Lethargic (at times) ?Orientation Level: Oriented to person;Disoriented to place;Disoriented to time;Disoriented to situation ?Memory: Impaired ?Memory Impairment: Storage deficit;Retrieval deficit;Decreased recall of new information ?Problem Solving: Impaired ?Problem Solving Impairment: Functional basic ?Safety/Judgment: Impaired  ?  ?   ?Comprehension ? Auditory Comprehension ?Overall Auditory Comprehension: Impaired ?Yes/No Questions: Impaired ?Complex Questions: 75-100% accurate ?Commands: Impaired ?Complex Commands: 50-74% accurate ?Conversation: Complex ?Interfering Components: Hearing ?EffectiveTechniques: Extra processing time;Increased volume;Repetition ?Visual Recognition/Discrimination ?Discrimination: Not tested ?Reading Comprehension ?Reading Status: Not tested  ?  ?Expression Expression ?Primary Mode of Expression: Verbal ?Verbal Expression ?Overall Verbal Expression: Impaired ?Initiation: No impairment ?Automatic Speech:  [facility]) ?Repetition: Impaired ?Level of Impairment: Sentence level ?Naming: Impairment ?Confrontation: Impaired ?Convergent: 75-100% accurate ?Verbal Errors: Semantic paraphasias;Perseveration ?Pragmatics: Impairment ?Impairments: Topic maintenance (due to cognitive-communication difficulty) ?Written Expression ?Dominant Hand: Right ?Written Expression: Not tested   ?Oral / Motor ? Oral Motor/Sensory Function ?Overall Oral Motor/Sensory Function: Within functional limits ?Motor Speech ?Overall Motor Speech: Appears within functional limits for tasks assessed ?Respiration: Within functional limits ?Phonation: Normal ?Resonance: Within functional  limits ?Articulation: Within functional limitis (minimal and occasional  articuatory imprecision) ?Intelligibility: Intelligible ?Motor Planning: Witnin functional limits   ?        ?[name], M.S., CCC-SLP ?Speech-Language Pathologist ?[facility] ?[phone]

## 2021-09-12 NOTE — Progress Notes (Signed)
?  Echocardiogram ?2D Echocardiogram has been performed. ? Hassie Bruce ?09/12/2021, 10:28 AM ?

## 2021-09-12 NOTE — Consult Note (Signed)
Neurology Consultation ?Reason for Consult: AMS, stroke on MRI ?Requesting Physician: Amy Cox ? ?CC: Altered mental status and increased falls in the past 24 hours ? ?History is obtained from: Chart review and daughter at bedside given patient dementia ? ?HPI: Pamela Wolfe is a 88 y.o. female with a past medical history significant for dementia, hypertension, prior CVA, heart failure with preserved EF and grade 3 diastolic dysfunction, recent UTI, and recent COVID-19 infection in early May. ? ?At baseline she is able to feed herself and is incontinent but knows her daughter though she is not otherwise oriented.  Per notes and confirmed with daughter at bedside, her daughter found her on the floor at 7:40 AM and on review of camera video the patient fell off the bed at 3:40 AM and remained on the floor until she was discovered at which time EMS was called.  Daughter additionally notes that the patient has been having at least daily falls out of bed since last week, with Friday having at least 3-4 falls.  Here in the hospital she was found to be in new onset paroxysmal atrial fibrillation, and noted to have had a recent diagnosis of E. coli UTI on 09/07/2021 for which she is being treated with ceftriaxone.  She is additionally on dual antiplatelet therapy for her history of stroke (2016, on dual antiplatelet therapy; daughter reports that she was told it was a midbrain stroke and affected the patient's speech and cognition).  She has been followed by Dr. Melrose Nakayama at [facility] since 2018. ? ?Daughter additionally notes that the patient has had worsening aphasia since her stroke and does occasionally answer questions with word salad answers.  She is concerned that this aphasia will soon preclude her mother's ability to attend senior daycare. ? ?LKW: 1 week prior to presentation ?tPA given?: No, out of the window ?Premorbid modified rankin scale:  ?    3 - Moderate disability. Requires some help, but able to walk  unassisted. ?    4 - Moderately severe disability. Unable to attend to own bodily needs without assistance, and unable to walk unassisted. ? ?ROS: Unable to obtain due to altered mental status.  ? ?Past Medical History:  ?Diagnosis Date  ? Depression   ? Essential hypertension 01/26/2017  ? GERD (gastroesophageal reflux disease)   ? History of stroke 12/08/2016  ? Loss of memory 12/08/2016  ? Macular degeneration   ? Neck pain 12/08/2016  ? Recurrent major depressive disorder, in full remission ([place]) 01/26/2017  ? Sleep apnea   ? Tremor 02/06/2017  ? ?Past Surgical History:  ?Procedure Laterality Date  ? ABDOMINAL HYSTERECTOMY  1975  ? BUNIONECTOMY  2016  ? CATARACT EXTRACTION, BILATERAL    ? CHOLECYSTECTOMY    ? SALIVARY GLAND SURGERY    ? ?Current Outpatient Medications  ?Medication Instructions  ? acetaminophen (TYLENOL) 500 mg, Oral, Every 6 hours PRN  ? albuterol (VENTOLIN HFA) 108 (90 Base) MCG/ACT inhaler 2 puffs, Inhalation, Every 6 hours PRN  ? aspirin EC 81 MG tablet Oral, Take 1 tablet 3 times a week on Monday, Wednesday and Friday.  ? b complex vitamins capsule 1 capsule, Oral, Daily  ? buPROPion (WELLBUTRIN SR) 150 MG 12 hr tablet 1 tablet, Oral, 2 times daily  ? ciprofloxacin (CIPRO) 250 mg, Oral, 2 times daily  ? citalopram (CELEXA) 20 mg, Oral, Daily  ? clopidogrel (PLAVIX) 75 MG tablet 1 tablet, Oral, Daily  ? CRANBERRY PO 252 mg, Oral, Every evening  ?  furosemide (LASIX) 40 mg, Oral, Daily  ? gentamicin (GARAMYCIN) 0.3 % ophthalmic solution 1 drop, Both Eyes, Every 4 hours  ? losartan (COZAAR) 50 mg, Oral, Daily  ? memantine (NAMENDA) 10 MG tablet 1 tablet, Oral, 2 times daily  ? Multiple Vitamins-Minerals (MULTIVITAMIN WOMEN) TABS 1 tablet, Oral, Daily  ? Multiple Vitamins-Minerals (PRESERVISION AREDS) CAPS 2 capsules, Oral, Every evening  ? pantoprazole (PROTONIX) 20 mg, Oral, Daily  ? Probiotic Product (PROBIOTIC PO) 1 capsule, Oral, Daily  ? traMADol (ULTRAM) 50 mg, Oral, 2 times daily PRN  ? traZODone  (DESYREL) 25 mg, Daily at bedtime  ? ? ? ?Family History  ?Problem Relation Age of Onset  ? Hypertension Mother   ? Hypertension Father   ? Depression Father   ? Bladder Cancer Neg Hx   ? Kidney cancer Neg Hx   ? Prostate cancer Neg Hx   ? ? ? ?Social History:  reports that she has quit smoking. She has never used smokeless tobacco. She reports current alcohol use. She reports that she does not use drugs. ? ?Exam: ?Current vital signs: ?BP (!) 170/76 (BP Location: Left Arm)   Pulse 75   Temp (!) 97.5 ?F (36.4 ?C) (Oral)   Resp 18   Ht '5\' 2"'$  (1.575 m)   Wt 90.7 kg   SpO2 93%   BMI 36.58 kg/m?  ?Vital signs in last 24 hours: ?Temp:  [97.5 ?F (36.4 ?C)-98.6 ?F (37 ?C)] 97.5 ?F (36.4 ?C) (05/14 0507) ?Pulse Rate:  [75-90] 75 (05/14 0507) ?Resp:  [18-21] 18 (05/14 0507) ?BP: (128-174)/(74-135) 170/76 (05/14 0507) ?SpO2:  [91 %-98 %] 93 % (05/14 0507) ?Weight:  [90.7 kg] 90.7 kg (05/13 0911) ? ? ?Physical Exam  ?Constitutional: Appears frail but no acute distress ?Psych: Very sleepy, but pleasant when she awakes ?Eyes: No scleral injection ?HENT: No oropharyngeal obstruction.  ?MSK: No significant joint deformities ?Cardiovascular: Irregularly irregular rhythm, perfusing extremities well ?Respiratory: Effort normal, non-labored breathing ?GI: Soft.  No distension. There is no tenderness.  ?Skin: Warm dry and intact visible skin with scattered bruising especially in the left hand ? ?Neuro: ?Mental Status: ?Patient is sleeping but awakens briefly to voice and follows some simple commands intermittently, often requiring mimicry to perform the task.  At times she is able to produce complete sentences but at other times she answers questions with nonsense syllables.  No evidence of neglect ?Cranial Nerves: ?II: Visual Fields are full per blink to threat. Pupils are equal, round, and reactive to light.   ?III,IV, VI: EOMI without ptosis to tracking examiner ?V: Facial sensation is symmetric to light touch ?VII: Facial  movement is symmetric to spontaneous smile ?VIII: hearing is intact to voice ?X: Uvula difficult to visualize ?XI: Shoulder shrug is symmetric 3/5, does not follow confrontational testing well ?XII: tongue is midline without atrophy or fasciculations.  ?Motor: ?Tone is normal. Bulk is normal.  No pronator drift in the upper extremities unable to maintain bilateral lower extremities off the bed for 5 seconds ?Sensory: ?Equally reactive to touch in all 4 extremities, no clear extinction ?Deep Tendon Reflexes: ?Difficult to assess secondary to patient relaxation, but appears symmetric and 1-2+ in the patellar and biceps ?Cerebellar: ?FNF and toe to hand are intact bilaterally ?Gait:  ?Deferred ? ?NIHSS total 6 ?Score breakdown: One-point for drowsiness, 2 points for not answering month or age correctly (reports it is January and she is 74 or 44), one-point for intermittently following commands, one-point for moderate aphasia, one-point for  mild dysarthria ?Performed at noon on 5/14   ? ? ?I have reviewed labs in epic and the results pertinent to this consultation are: ? ?Basic Metabolic Panel: ?Recent Labs  ?Lab 09/11/21 ?1610 09/11/21 ?1356 09/12/21 ?9604  ?NA 135  --  139  ?K 2.8*  --  3.0*  ?CL 98  --  107  ?CO2 29  --  29  ?GLUCOSE 127*  --  109*  ?BUN 14  --  14  ?CREATININE 0.72  --  0.83  ?CALCIUM 9.0  --  8.9  ?MG  --  2.1  --   ?PHOS  --  3.5  --   ? ? ?CBC: ?Recent Labs  ?Lab 09/11/21 ?5409 09/12/21 ?8119  ?WBC 9.3 7.4  ?NEUTROABS 7.3  --   ?HGB 12.9 12.3  ?HCT 37.7 35.9*  ?MCV 89.1 90.4  ?PLT 260 234  ? ? ?Coagulation Studies: ?No results for input(s): LABPROT, INR in the last 72 hours.  ? ?Lab Results  ?Component Value Date  ? CHOL 185 09/12/2021  ? HDL 49 09/12/2021  ? LDLCALC 120 (H) 09/12/2021  ? TRIG 81 09/12/2021  ? CHOLHDL 3.8 09/12/2021  ? ?Lab Results  ?Component Value Date  ? HGBA1C 5.3 09/11/2021  ? ?Lab Results  ?Component Value Date  ? TSH 2.421 09/11/2021  ? ?EKG personally reviewed,  irregularly irregular rhythm c/w afib  ? ?I have reviewed the images obtained: ? ?MRI brain personally reviewed, agree with radiology that there is a small acute to subacute right basal ganglia infarct and extensive chronic s

## 2021-09-12 NOTE — Progress Notes (Signed)
SLP Cancellation Note ? ?Patient Details ?Name: Pamela Wolfe ?MRN: [id number] ?DOB: Apr 15, 1934 ? ? ?Cancelled treatment:       Reason Eval/Treat Not Completed: Patient at procedure or test/unavailable  ? ?SLP consult received and appreciated. Chart review completed. Pt currently OTF in CT. Will continue efforts as appropriate. ? ?[name], M.S., CCC-SLP ?Speech-Language Pathologist ?[facility] ?([phone] ([place])  ? ?[name] ?09/12/2021, 8:17 AM ?

## 2021-09-12 NOTE — [place] FL2 (Signed)
?[place] MEDICAID FL2 LEVEL OF CARE SCREENING TOOL  ?  ? ?IDENTIFICATION  ?Patient Name: ?Pamela Wolfe Birthdate: 04/15/1934 Sex: female Admission Date (Current Location): ?09/11/2021  ?[place] and [place] Number: ? [place] ?  Facility and Address:  ?[facility], [address] [place],  [address] ?     Provider Number: ?[id number]  ?Attending Physician Name and Address:  ?Loletha Grayer, MD ? Relative Name and Phone Number:  ?Kyra Searles ([place]) [phone] ?   ?Current Level of Care: ?Hospital Recommended Level of Care: ?[place] Prior Approval Number: ?  ? ?Date Approved/Denied: ?  PASRR Number: ?[id number] ? ?Discharge Plan: ?SNF ?  ? ?Current Diagnoses: ?Patient Active Problem List  ? Diagnosis Date Noted  ? Altered mental status 09/11/2021  ? Sepsis secondary to UTI ([place]) 09/11/2021  ? UTI (urinary tract infection) 09/11/2021  ? Hypokalemia 09/11/2021  ? Insomnia 09/11/2021  ? Paroxysmal atrial fibrillation ([place]) 09/11/2021  ? History of COVID-19 09/11/2021  ? Elevated troponin 09/11/2021  ? Acute heart failure ([place]) 05/20/2021  ? Depression   ? Flash pulmonary edema (HCC)   ? Acute respiratory failure with hypoxia ([place])   ? Tremor 02/06/2017  ? Essential hypertension 01/26/2017  ? Recurrent major depressive disorder, in full remission ([place]) 01/26/2017  ? History of stroke 12/08/2016  ? Memory loss 12/08/2016  ? Neck pain 12/08/2016  ? ? ?Orientation RESPIRATION BLADDER Height & Weight   ?  ?Self ? Normal Incontinent Weight: 200 lb (90.7 kg) ?Height:  '5\' 2"'$  (157.5 cm)  ?BEHAVIORAL SYMPTOMS/MOOD NEUROLOGICAL BOWEL NUTRITION STATUS  ?    Continent Diet (see discharge summary)  ?AMBULATORY STATUS COMMUNICATION OF NEEDS Skin   ?Limited Assist Verbally Normal ?  ?  ?  ?    ?     ?     ? ? ?Personal Care Assistance Level of Assistance  ?Bathing, Feeding, Dressing, Total care Bathing Assistance: Limited assistance ?Feeding assistance: Limited assistance ?Dressing  Assistance: Limited assistance ?Total Care Assistance: Limited assistance  ? ?Functional Limitations Info  ?Sight, Hearing, Speech Sight Info: Impaired ?Hearing Info: Impaired ?Speech Info: Adequate  ? ? ?SPECIAL CARE FACTORS FREQUENCY  ?PT (By licensed PT), OT (By licensed OT)   ?  ?PT Frequency: min 4x weekly ?OT Frequency: min 4x weekly ?  ?  ?  ?   ? ? ?Contractures Contractures Info: Not present  ? ? ?Additional Factors Info  ?Code Status, Allergies Code Status Info: DNR ?Allergies Info: no known allergies ?  ?  ?  ?   ? ?Current Medications (09/12/2021):  This is the current hospital active medication list ?Current Facility-Administered Medications  ?Medication Dose Route Frequency Provider Last Rate Last Admin  ? acetaminophen (TYLENOL) tablet 1,000 mg  1,000 mg Oral Q6H PRN Cox, Amy N, DO      ? Or  ? acetaminophen (TYLENOL) suppository 650 mg  650 mg Rectal Q6H PRN Cox, Amy N, DO      ? albuterol (PROVENTIL) (2.5 MG/3ML) 0.083% nebulizer solution 3 mL  3 mL Inhalation Q6H PRN Cox, Amy N, DO      ? [START ON 09/13/2021] aspirin EC tablet 81 mg  81 mg Oral Once per day on Mon Wed Fri Cox, Amy N, DO      ? atorvastatin (LIPITOR) tablet 40 mg  40 mg Oral QHS Bhagat, Srishti L, MD      ? buPROPion (WELLBUTRIN SR) 12 hr tablet 150 mg  150 mg Oral BID Cox, Amy  N, DO   150 mg at 09/11/21 1421  ? cefTRIAXone (ROCEPHIN) 1 g in sodium chloride 0.9 % 100 mL IVPB  1 g Intravenous Q24H Cox, Amy N, DO      ? citalopram (CELEXA) tablet 20 mg  20 mg Oral Daily Cox, Amy N, DO   20 mg at 09/11/21 1352  ? clopidogrel (PLAVIX) tablet 75 mg  75 mg Oral QHS Cox, Amy N, DO   75 mg at 09/11/21 2351  ? enoxaparin (LOVENOX) injection 45 mg  0.5 mg/kg Subcutaneous QHS Cox, Amy N, DO      ? furosemide (LASIX) tablet 40 mg  40 mg Oral Daily Cox, Amy N, DO   40 mg at 09/11/21 1352  ? LORazepam (ATIVAN) injection 2 mg  2 mg Intravenous PRN Cox, Amy N, DO      ? losartan (COZAAR) tablet 50 mg  50 mg Oral Daily Cox, Amy N, DO   50 mg at  09/11/21 1352  ? melatonin tablet 5 mg  5 mg Oral QHS PRN Cox, Amy N, DO      ? memantine (NAMENDA) tablet 10 mg  10 mg Oral BID Cox, Amy N, DO   10 mg at 09/11/21 1352  ? multivitamin with minerals tablet 1 tablet  1 tablet Oral Daily Cox, Amy N, DO   1 tablet at 09/11/21 1352  ? ondansetron (ZOFRAN) tablet 4 mg  4 mg Oral Q6H PRN Cox, Amy N, DO      ? Or  ? ondansetron (ZOFRAN) injection 4 mg  4 mg Intravenous Q6H PRN Cox, Amy N, DO      ? pantoprazole (PROTONIX) EC tablet 20 mg  20 mg Oral Daily Cox, Amy N, DO   20 mg at 09/11/21 1421  ? polyethylene glycol (MIRALAX / GLYCOLAX) packet 17 g  17 g Oral Daily PRN Cox, Amy N, DO      ? potassium chloride 10 mEq in 100 mL IVPB  10 mEq Intravenous Q1 Hr x 2 Wieting, Richard, MD 100 mL/hr at 09/12/21 0753 10 mEq at 09/12/21 0753  ? tobramycin (TOBREX) 0.3 % ophthalmic solution 1 drop  1 drop Both Eyes Q4H Cox, Amy N, DO   1 drop at 09/12/21 0754  ? ? ? ?Discharge Medications: ?Please see discharge summary for a list of discharge medications. ? ?Relevant Imaging Results: ? ?Relevant Lab Results: ? ? ?Additional Information ?SSN:[id number] ? ?Alberteen Sam, LCSW ? ? ? ? ?

## 2021-09-12 NOTE — Progress Notes (Signed)
SLP Cancellation Note ? ?Patient Details ?Name: Pamela Wolfe ?MRN: [id number] ?DOB: [birth year] ? ? ?Cancelled treatment:       Reason Eval/Treat Not Completed: Patient at procedure or test/unavailable  ? ?Pt undergoing echocardiogram. Will continue efforts as appropriate. ? ?[name], M.S., CCC-SLP ?Speech-Language Pathologist ?[facility] ?[phone] ([place]) ? ?[name] ?09/12/2021, 11:07 AM ?

## 2021-09-12 NOTE — Progress Notes (Signed)
Cross cover ordered stroke order-set and ok'ed administration of plavix.  ?

## 2021-09-12 NOTE — Evaluation (Signed)
Occupational Therapy Evaluation ?Patient Details ?Name: Pamela Wolfe ?MRN: [id number] ?DOB: Sep 05, 1933 ?Today's Date: 09/12/2021 ? ? ?History of Present Illness presented to ER secondary to progressive weakness, difficulty with speech, AMS, recurrent falls; admitted for medical work-up of AMS (differentials include UTI, CVA per notes), workup ongoing.  ? ?Clinical Impression ?  ?Pt seen for OT evaluation this date.  Pt in bed on arrival, initially lethargic but was able to arouse patient for active participation in therapy.  No family in the room this date, chart indicates she lives with her daughter, when asked, pt states she lives with her mom.  She appears confused but is able to state her name and when asked where she was, she stated, "[facility]".  Pt required min to mod assist for bed mobility, demonstrates posterior lean with unsupported sitting.  Pt able to follow one step commands during session.  She was able to perform grooming with min assist, self feeding with setup for food tray.  Appears to demonstrate decreased ROM in right UE when attempting to comb hair and when washing face.  Pt presents with muscle weakness, decreased coordination, decreased transfers, decreased functional mobility, decreased cognition and decreased ability to perform self care and IADL tasks.  She would benefit from skilled OT services to maximize safety and independence with necessary daily tasks.  She would benefit from short term rehab upon discharge.  ?   ? ?Recommendations for follow up therapy are one component of a multi-disciplinary discharge planning process, led by the attending physician.  Recommendations may be updated based on patient status, additional functional criteria and insurance authorization.  ? ?Follow Up Recommendations ? Skilled nursing-short term rehab (<3 hours/day)  ?  ?Assistance Recommended at Discharge Frequent or constant Supervision/Assistance  ?Patient can return home with the following A lot of help with  walking and/or transfers;A lot of help with bathing/dressing/bathroom ? ?  ?Functional Status Assessment ? Patient has had a recent decline in their functional status and demonstrates the ability to make significant improvements in function in a reasonable and predictable amount of time.  ?Equipment Recommendations ?    ?  ?Recommendations for Other Services   ? ? ?  ?Precautions / Restrictions Precautions ?Precautions: Fall ?Restrictions ?Weight Bearing Restrictions: No  ? ?  ? ?Mobility Bed Mobility ?Overal bed mobility: Needs Assistance ?Bed Mobility: Supine to Sit ?  ?  ?Supine to sit: Min assist, Mod assist ?  ?  ?General bed mobility comments: min assist for unsupported sitting, has some posterior lean ?  ? ?Transfers ?  ?  ?  ?  ?  ?  ?  ?  ?  ?General transfer comment: Did not perform sit to stand transfer this date, returned to supine position ?  ? ?  ?Balance Overall balance assessment: Needs assistance ?Sitting-balance support: No upper extremity supported, Feet supported ?Sitting balance-Leahy Scale: Fair ?Sitting balance - Comments: pt demonstrates some posterior leaning. ?Postural control: Posterior lean ?  ?  ?  ?  ?  ?  ?  ?  ?  ?  ?  ?  ?  ?  ?   ? ?ADL either performed or assessed with clinical judgement  ? ?ADL Overall ADL's : Needs assistance/impaired ?Eating/Feeding: Set up;Bed level ?  ?Grooming: Set up;Minimal assistance ?Grooming Details (indicate cue type and reason): Pt demonstrates some perseveration of task with grooming.  Was able to wash face, comb hair and brush teeth with setup, cues and occasional assist.  Decreased ROM on  right UE with reaching to comb hair on right side. ?Upper Body Bathing: Minimal assistance ?  ?Lower Body Bathing: Maximal assistance ?  ?Upper Body Dressing : Moderate assistance ?  ?Lower Body Dressing: Total assistance ?  ?  ?  ?  ?  ?  ?  ?  ?   ? ? ? ?Vision Baseline Vision/History: 1 Wears glasses ?   ?   ?Perception   ?  ?Praxis   ?  ? ?Pertinent Vitals/Pain  Pain Assessment ?Pain Assessment: No/denies pain  ? ? ? ?Hand Dominance Right ?  ?Extremity/Trunk Assessment Upper Extremity Assessment ?Upper Extremity Assessment: Generalized weakness ?  ?Lower Extremity Assessment ?Lower Extremity Assessment: Defer to PT evaluation ?  ?  ?  ?Communication Communication ?Communication: Expressive difficulties ?  ?Cognition Arousal/Alertness: Awake/alert ?Behavior During Therapy: Anxious ?Overall Cognitive Status: Impaired/Different from baseline ?  ?  ?  ?  ?  ?  ?  ?  ?  ?  ?  ?  ?  ?  ?  ?  ?General Comments: Pt able to state her name, unsure of situation, can state "[facility]" as to where she is.  She is able to follow one step commands, increased processing time and some perseveration noted this date with tasks. ?  ?  ?General Comments   Pt seen this date for grooming tasks in bed with head of bed raised, she is not able to complete in supported sitting.  Required min assist with grooming.  Setup for food tray and was able to use utensils for self feeding.   ? ?  ?Exercises   ?  ?Shoulder Instructions    ? ? ?Home Living Family/patient expects to be discharged to:: Private residence ?Living Arrangements: Children ?Available Help at Discharge: Family;Available PRN/intermittently ?Type of Home: House ?Home Access: Stairs to enter ?Entrance Stairs-Number of Steps: 3 ?Entrance Stairs-Rails: Can reach both ?Home Layout: One level ?  ?  ?Bathroom Shower/Tub: Walk-in shower ?  ?Bathroom Toilet: Standard ?Bathroom Accessibility: No ?  ?Home Equipment: Rollator (4 wheels);Standard Sia;Shower seat ?  ?Additional Comments: Pt lives with daughter; goes to "day school" M-F from 9-3:30. ?  ? ?  ?Prior Functioning/Environment Prior Level of Function : Independent/Modified Independent;History of Falls (last six months) ?  ?  ?  ?  ?  ?  ?Mobility Comments: Recent use of SW for mobility needs; daughter endorses progressive increase in level of assist required over recent 2-3 weeks ?ADLs  Comments: Independent with ADLs including dressing, toileting, and showering. Does not use shower chair. Assist with IADLs. ?  ? ?  ?  ?OT Problem List: Decreased strength;Decreased knowledge of use of DME or AE;Decreased range of motion;Decreased activity tolerance;Decreased cognition;Impaired UE functional use;Impaired balance (sitting and/or standing) ?  ?   ?OT Treatment/Interventions: Self-care/ADL training;Therapeutic exercise;Patient/family education;Neuromuscular education;Balance training;Therapeutic activities;DME and/or AE instruction;Cognitive remediation/compensation  ?  ?OT Goals(Current goals can be found in the care plan section) Acute Rehab OT Goals ?Patient Stated Goal: Pt reports she wants to go back home with her family ?OT Goal Formulation: With patient ?Time For Goal Achievement: 09/26/21 ?Potential to Achieve Goals: Fair ?ADL Goals ?Pt Will Perform Lower Body Dressing: with min assist ?Pt Will Transfer to Toilet: with min assist  ?OT Frequency: Min 2X/week ?  ? ?Co-evaluation   ?  ?  ?  ?  ? ?  ?AM-PAC OT "6 Clicks" Daily Activity     ?Outcome Measure Help from another person eating meals?: None ?Help from  another person taking care of personal grooming?: A Little ?Help from another person toileting, which includes using toliet, bedpan, or urinal?: A Lot ?Help from another person bathing (including washing, rinsing, drying)?: A Lot ?Help from another person to put on and taking off regular upper body clothing?: A Little ?Help from another person to put on and taking off regular lower body clothing?: A Lot ?6 Click Score: 16 ?  ?End of Session   ? ?Activity Tolerance: Patient tolerated treatment well ?Patient left: in bed;with call bell/phone within reach;with bed alarm set ? ?OT Visit Diagnosis: Muscle weakness (generalized) (M62.81);History of falling (Z91.81);Unsteadiness on feet (R26.81);Cognitive communication deficit (R41.841)  ?              ?Time: 2426-8341 ?OT Time Calculation (min):  35 min ?Charges:  OT General Charges ?$OT Visit: 1 Visit ?OT Evaluation ?$OT Eval Low Complexity: 1 Low ?OT Treatments ?$Self Care/Home Management : 8-22 mins ? ?Ndeye Tenorio Oneita Jolly, OTR/L, CLT ?09/12/2021, 9:55 AM ?

## 2021-09-13 DIAGNOSIS — I480 Paroxysmal atrial fibrillation: Secondary | ICD-10-CM | POA: Diagnosis not present

## 2021-09-13 DIAGNOSIS — I4729 Other ventricular tachycardia: Secondary | ICD-10-CM | POA: Diagnosis not present

## 2021-09-13 DIAGNOSIS — R778 Other specified abnormalities of plasma proteins: Secondary | ICD-10-CM | POA: Diagnosis not present

## 2021-09-13 DIAGNOSIS — I639 Cerebral infarction, unspecified: Secondary | ICD-10-CM | POA: Diagnosis not present

## 2021-09-13 LAB — BASIC METABOLIC PANEL
Anion gap: 8 (ref 5–15)
BUN: 19 mg/dL (ref 8–23)
CO2: 29 mmol/L (ref 22–32)
Calcium: 8.9 mg/dL (ref 8.9–10.3)
Chloride: 102 mmol/L (ref 98–111)
Creatinine, Ser: 0.85 mg/dL (ref 0.44–1.00)
GFR, Estimated: >60 mL/min (ref >60)
Glucose, Bld: 117 mg/dL — ABNORMAL HIGH (ref 70–99)
Potassium: 3.3 mmol/L — ABNORMAL LOW (ref 3.5–5.1)
Sodium: 139 mmol/L (ref 135–145)

## 2021-09-13 LAB — MAGNESIUM: Magnesium: 2.1 mg/dL (ref 1.7–2.4)

## 2021-09-13 LAB — URINE CULTURE: Culture: NO GROWTH

## 2021-09-13 MED ORDER — POTASSIUM CHLORIDE CRYS ER 20 MEQ PO TBCR
40.0000 meq | EXTENDED_RELEASE_TABLET | Freq: Once | ORAL | Status: AC
  Administered 2021-09-13: 40 meq via ORAL
  Filled 2021-09-13: qty 2

## 2021-09-13 MED ORDER — POTASSIUM CHLORIDE CRYS ER 20 MEQ PO TBCR: 40.0000 meq | EXTENDED_RELEASE_TABLET | Freq: Every day | ORAL | Status: DC

## 2021-09-13 NOTE — TOC Progression Note (Signed)
Transition of Care (TOC) - Progression Note  ? ? ?Patient Details  ?Name: Mechel Haggard ?MRN: [id number] ?Date of Birth: [birth year] ? ?Transition of Care (TOC) CM/SW Contact  ?Pete Pelt, RN ?Phone Number: ?09/13/2021, 4:41 PM ? ?Clinical Narrative:  Patient's family would like to forego SNF at this time and take patient home.  Family states they have all DME except for hospital bed.  Hospitalist has placed order, RNCM has placed order with [facility], who are anticipating to be able to deliver bed tomorrow.  Toc to follow.  ? ? ? ?Expected Discharge Plan: [place] ?Barriers to Discharge: Continued Medical Work up ? ?Expected Discharge Plan and Services ?Expected Discharge Plan: [place] ?  ?Discharge Planning Services: CM Consult ?  ?Living arrangements for the past 2 months: [place] ?                ?DME Arranged: Hospital bed ?DME Agency: [facility] ?Date DME Agency Contacted: 09/13/21 ?Time DME Agency Contacted: [id number] ?  ?  ?[place] Agency: [place] ?Date HH Agency Contacted: 09/13/21 ?Time [place]: [id number] ?Representative spoke with at [place]: [place] ? ? ?Social Determinants of Health (SDOH) Interventions ?  ? ?Readmission Risk Interventions ? ?  09/13/2021  ?  4:37 PM 05/22/2021  ? 10:38 AM  ?Readmission Risk Prevention Plan  ?Post Dischage Appt  Complete  ?Medication Screening  Complete  ?Transportation Screening Complete Complete  ?PCP or Specialist Appt within 5-7 Days Complete   ?Home Care Screening Complete   ?Medication Review (RN CM) Complete   ? ? ?

## 2021-09-13 NOTE — Progress Notes (Signed)
Nutrition Brief Note ? ?Patient identified on the Malnutrition Screening Tool (MST) Report ? ?Wt Readings from Last 15 Encounters:  ?09/11/21 90.7 kg  ?05/22/21 96.5 kg  ?12/04/19 94.3 kg  ?09/27/19 94.3 kg  ?10/10/17 92.1 kg  ?04/11/17 90.3 kg  ? ?Pt with history of hypertension, CVA, GERD, heart failure preserved ejection fraction, heart failure grade 3 diastolic dysfunction, recent diagnosis of UTI, memory decline concerning for dementia, who presents for chief concerns of frequent falls in the last 24 hours. ? ?Pt admitted with AMS and stroke.  ? ?Case discussed with RN, who reports pt is alert and oriented to self. Pt takes medications slowly and does best when medications are crushed in applesauce.  ? ?Pt reports feeling well today. She consumed all of her breakfast. She reports good appetite PTA.  ? ?Reviewed wt hx; pt has experienced a 6.1% wt loss over the past 4 months, which is not significant for time frame. Noted wt fluctuations, likely due to fluid losses from CHF.  ? ?Nutrition-Focused physical exam completed. Findings are no fat depletion, no muscle depletion, and mild edema.   ? ?Body mass index is 36.58 kg/m?Marland Kitchen Patient meets criteria for obesity, class II based on current BMI.  ? ?Current diet order is regular, patient is consuming approximately 100% of meals at this time. Labs and medications reviewed.  ? ?No nutrition interventions warranted at this time. If nutrition issues arise, please consult RD.  ? ?Loistine Chance, RD, LDN, CDCES ?Registered Dietitian II ?Certified Diabetes Care and Education Specialist ?Please refer to [facility] for RD and/or RD on-call/weekend/after hours pager   ?

## 2021-09-13 NOTE — TOC Progression Note (Signed)
Transition of Care (TOC) - Progression Note  ? ? ?Patient Details  ?Name: Pamela Wolfe ?MRN: [id number] ?Date of Birth: 1933/07/05 ? ?Transition of Care (TOC) CM/SW Contact  ?Pete Pelt, RN ?Phone Number: ?09/13/2021, 11:08 AM ? ?Clinical Narrative:   As per Meg at [facility], patient is currently open with them if going home. ? ? ? ?Expected Discharge Plan: [place] ?Barriers to Discharge: Continued Medical Work up ? ?Expected Discharge Plan and Services ?Expected Discharge Plan: [place] ?  ?  ?  ?Living arrangements for the past 2 months: [place] ?                ?  ?  ?  ?  ?  ?  ?  ?  ?  ?  ? ? ?Social Determinants of Health (SDOH) Interventions ?  ? ?Readmission Risk Interventions ? ?  05/22/2021  ? 10:38 AM  ?Readmission Risk Prevention Plan  ?Post Dischage Appt Complete  ?Medication Screening Complete  ?Transportation Screening Complete  ? ? ?

## 2021-09-13 NOTE — Progress Notes (Signed)
Speech Language Pathology Treatment:    ?Patient Details ?Name: Pamela Wolfe ?MRN: [id number] ?DOB: [birth year] ?Today's Date: 09/13/2021 ?Time: 1010-1028 ?SLP Time Calculation (min) (ACUTE ONLY): 18 min ? ?Assessment / Plan / Recommendation ?Clinical Impression ? Pt seen for skilled SLP services targeting cognitive-communication. Pt alert, pleasant, and cooperative. HOH.  ? ?Pt oriented to self. Place with extra time and use of environmental aids. Pt not oriented to situation or time despite max cueing/use of environmental aids. Observed pt during informal conversational exchanges with MD. Pt able to verbally respond to all questions with extra time. ?reliability of responses given short term memory deficits (e.g. pt stating she did not receive therapy services yesterday).Pt followed 2-step commands with 50% accuracy; pt with perseverative motor planning and incomplete ability to follow 2-step commands (e.g. following 1-part). Pt named common objects with ~60% accuracy. Semantic paraphasias and neologisms appreciated. Verbal hierarchical cueing was limited in effectiveness. ? ?Pt continues to present with cognitive-communication deficits. Recommend ongoing SLP services acute/post-acute.  ? ?Pt, pt's daughter, and RN made aware of results, recommendations, and POC. ?full understanding by pt.  ?  ?HPI HPI: Per H&P "Ms. Pamela Wolfe is a 88 year old female with history of hypertension, CVA, GERD, heart failure preserved ejection fraction, heart failure grade 3 diastolic dysfunction, recent diagnosis of UTI, memory decline concerning for dementia, who presents emergency department for chief concerns of frequent falls in the last 24 hours.    Initial vitals in the emergency department showed temperature of 98.3, respiration rate of 18, heart rate of 86, blood pressure 157/85, SPO2 of 94% on room air.    Serum sodium is 134, potassium 2.8, chloride 98, bicarb 29, BUN of 14, serum creatinine of 0.72, GFR greater than 60,  nonfasting blood glucose 127, WBC 9.3, hemoglobin 12.9, platelets of 260.    COVID/influenza A/influenza B PCR were negative.  High sensitive troponin was 19.  Lactic acid was 1.0.    UA in the emergency department collected was negative for leukocytes or nitrates.    Blood cultures x2 have been collected and are in fact, with repeat urine culture collected and is in process.     ED treatment: ceftriaxone 1 g IV.     At bedside, she was able to tell me her name. She was not able to tell me her age, location, or current year. She was not able to identify her daughter at bedside.      She had covid 19 about two weeks ago.      At baseline, daughter states that she knows daughter, feeds herself and goes to senior daycare. She has been urinating in her depends and pads at daycare.     She normally wakes up early in the morning. She was found on the floor at 7:40 am when daughter woke up in the morning and looked at the camera. Per camera, at 3:40 AM, she slid off the bed and layed on the floor until 7:40 am, prompting daughter to call ems.      Social history: She lives with her daughter, Pamela Wolfe. She is a former tobacco user, quitting at age 39. She does not drink etoh anymore. She is retired and formerly worked as Designer, multimedia at [employer].     Vaccination history: She is vaccinated for covid 19 and influenza.      ROS: Unable to complete due to altered mental status and baseline demnentia" ?  ?   ?SLP Plan ? Continue with current plan of care ? ?  ?  ?  Recommendations for follow up therapy are one component of a multi-disciplinary discharge planning process, led by the attending physician.  Recommendations may be updated based on patient status, additional functional criteria and insurance authorization. ?  ? ?Recommendations  ?   ? ? ? ? Follow Up Recommendations: Skilled nursing-short term rehab (<3 hours/day) ?Assistance recommended at discharge: Frequent or constant Supervision/Assistance ?SLP Visit  Diagnosis: Cognitive communication deficit (R41.841) ?Plan: Continue with current plan of care ? ? ? ? ?  ?  ? ?[name], M.S., CCC-SLP ?Speech-Language Pathologist ?[facility] ?[phone] ([phone] ? ?[name] ? ?09/13/2021, 12:54 PM ?

## 2021-09-13 NOTE — Assessment & Plan Note (Addendum)
Only 6 beats of NSVT.  With echocardiogram showing a normal ejection fraction, I think this is secondary to electrolyte abnormalities.  Replace potassium daily. ?

## 2021-09-13 NOTE — Progress Notes (Signed)
?Progress Note ? ? ?Patient: Pamela Wolfe RDE:[id number] DOB: 12-04-1933 DOA: 09/11/2021     1 ?DOS: the patient was seen and examined on 09/13/2021 ?  ?Brief hospital course: ?Ms. Pamela Wolfe is a 88 year old female with history of hypertension, CVA, GERD, memory loss.  Presented to the emergency room with difficulty speaking, falls.  By MRI she was found to have acute to subacute stroke of the right nasal cannula ? ? ? ?Assessment and Plan: ?* Acute ischemic stroke ([place]) ?The patient takes aspirin 3 times a week  and Plavix daily at home already.  Increased aspirin to daily dosing and continue Plavix.  Neurology mention that we can consider Eliquis in a couple weeks as outpatient.  Had brief episode of atrial fibrillation in the emergency room.  LDL 130 and started on Lipitor.  CT angio shows 45% blockage right ICA.  Echocardiogram shows normal EF. ? ?Paroxysmal atrial fibrillation ([place]) ?Brief atrial fibrillation in the emergency room.  Currently in normal sinus rhythm. ? ?NSVT (nonsustained ventricular tachycardia) ([place]) ?With echocardiogram being normal ejection fraction I think this is secondary to electrolyte abnormalities.  Replace potassium again today. ? ?Elevated troponin ?Likely secondary to stroke. ? ?Insomnia ?Trazodone discontinued. Melatonin 5 mg nightly as needed for sleep ordered ? ?Hypokalemia ?Replace potassium orally again today ? ?Altered mental status ?Patient answering some questions today.  Likely secondary to stroke ? ?Depression ?Continue home bupropion 150 mg p.o. twice daily, citalopram 20 mg daily ? ?Memory loss ?Continue memantine 10 mg twice daily p.o. ? ?Essential hypertension ?Continue losartan 50 mg daily, furosemide 40 mg p.o. daily ? ? ? ? ?  ? ?Subjective: Patient was able to put in her own hearing aid today and her speech was a little clearer.  I saw her walking around the hallway with the physical therapist.  Admitted with a stroke. ? ?Physical Exam: ?Vitals:  ? 09/13/21 0105  09/13/21 0511 09/13/21 0900 09/13/21 1150  ?BP: (!) 147/52 (!) 132/59 130/62 (!) 125/52  ?Pulse: 79 71 68 74  ?Resp: '18 18 16 16  '$ ?Temp: 97.7 ?F (36.5 ?C) 97.8 ?F (36.6 ?C) 97.6 ?F (36.4 ?C) 97.8 ?F (36.6 ?C)  ?TempSrc:   Oral Oral  ?SpO2: 93% 95% 92% 95%  ?Weight:      ?Height:      ? ?Physical Exam ?HENT:  ?   Head: Normocephalic.  ?   Mouth/Throat:  ?   Pharynx: No oropharyngeal exudate.  ?Eyes:  ?   General: Lids are normal.  ?   Conjunctiva/sclera: Conjunctivae normal.  ?Cardiovascular:  ?   Rate and Rhythm: Normal rate and regular rhythm.  ?   Heart sounds: Normal heart sounds, S1 normal and S2 normal.  ?Pulmonary:  ?   Breath sounds: Normal breath sounds. No decreased breath sounds, wheezing, rhonchi or rales.  ?Abdominal:  ?   Palpations: Abdomen is soft.  ?   Tenderness: There is no abdominal tenderness.  ?Musculoskeletal:  ?   Right lower leg: No swelling.  ?   Left lower leg: No swelling.  ?Skin: ?   General: Skin is warm.  ?   Findings: No rash.  ?Neurological:  ?   Mental Status: She is alert.  ?  ?Data Reviewed: ?Potassium 3.3, LDL 120 ? ?Family Communication: Spoke with patient's daughter on the phone ? ?Disposition: ?Status is: Inpatient ?Remains inpatient appropriate because: Physical therapy recommending rehab.  Patient's daughter is thinking about getting her home. ? ?Planned Discharge Destination: Home with Home Health versus  rehab. ? ?Author: ?Loletha Grayer, MD ?09/13/2021 4:41 PM ? ?For on call review [email or web address].  ?

## 2021-09-13 NOTE — Progress Notes (Signed)
Physical Therapy Treatment ?Patient Details ?Name: Pamela Wolfe ?MRN: [id number] ?DOB: 30-Jun-1933 ?Today's Date: 09/13/2021 ? ? ?History of Present Illness presented to ER secondary to progressive weakness, difficulty with speech, AMS, recurrent falls; admitted for medical work-up of AMS. MRI of brain reports small acute to subacute right basal ganglia infarct and extensive chronic small vessel ischemic disease ? ?  ?PT Comments  ? ? Patient is cooperative and able to follow single step commands with multi-modal cues and extra time. Supportive daughter at the bedside. The patient has increased independence and activity tolerance this session. She does require minimal assistance for bed mobility. Initially minimal assistance required for ambulation for safety, but patient progressed to Min guard assistance and ambulated a lap in the hallway. Occasional cues for safety and navigation cues required to return to room. Recommend to continue PT to maximize independence and facilitate return to prior level of function. Patient would likely require minimal assistance with mobility at discharge and family reports the goal is SNF placement at this time. ?  ?Recommendations for follow up therapy are one component of a multi-disciplinary discharge planning process, led by the attending physician.  Recommendations may be updated based on patient status, additional functional criteria and insurance authorization. ? ?Follow Up Recommendations ? Skilled nursing-short term rehab (<3 hours/day) ?  ?  ?Assistance Recommended at Discharge Intermittent Supervision/Assistance  ?Patient can return home with the following A little help with walking and/or transfers;A little help with bathing/dressing/bathroom;Assist for transportation;Help with stairs or ramp for entrance ?  ?Equipment Recommendations ? None recommended by PT  ?  ?Recommendations for Other Services   ? ? ?  ?Precautions / Restrictions Precautions ?Precautions:  Fall ?Restrictions ?Weight Bearing Restrictions: No  ?  ? ?Mobility ? Bed Mobility ?Overal bed mobility: Needs Assistance ?Bed Mobility: Supine to Sit, Sit to Supine ?  ?  ?Supine to sit: Min assist ?Sit to supine: Min assist ?  ?General bed mobility comments: assistance for trunk support to sit upright and assistance for LE support to return to bed. multi-modal cues required with increased time/effort needed ?  ? ?Transfers ?Overall transfer level: Needs assistance ?Equipment used: Rolling [place] (2 wheels) ?Transfers: Sit to/from Stand ?Sit to Stand: Min guard ?  ?  ?  ?  ?  ?General transfer comment: verbal cues for hand placement with transfers ?  ? ?Ambulation/Gait ?Ambulation/Gait assistance: Min guard, Min assist ?Gait Distance (Feet): 170 Feet ?Assistive device: Rolling [place] (2 wheels) ?Gait Pattern/deviations: Decreased stride length ?Gait velocity: decreased ?  ?  ?General Gait Details: Min A initially for steadying, progressing to Min guard with increased ambulation distance. occasional cues for safety using rolling [place] with cues needed in hallway for navigation back to room. Sp02 94% on room air after walking with heart rate of 95bpm ? ? ?Stairs ?  ?  ?  ?  ?  ? ? ?Wheelchair Mobility ?  ? ?Modified Rankin (Stroke Patients Only) ?  ? ? ?  ?Balance Overall balance assessment: Needs assistance ?Sitting-balance support: No upper extremity supported, Feet supported ?Sitting balance-Leahy Scale: Good ?  ?  ?Standing balance support: Bilateral upper extremity supported, During functional activity, Reliant on assistive device for balance ?Standing balance-Leahy Scale: Fair ?Standing balance comment: using rolling [place] for support in standing. Min A guard for safety. mild dizziness initially that subsides with increased standing time ?  ?  ?  ?  ?  ?  ?  ?  ?  ?  ?  ?  ? ?  ?  Cognition Arousal/Alertness: Awake/alert ?Behavior During Therapy: [facility] for tasks assessed/performed ?Overall Cognitive Status:  History of cognitive impairments - at baseline ?  ?  ?  ?  ?  ?  ?  ?  ?  ?  ?  ?  ?  ?  ?  ?  ?General Comments: patient is able to follow single step commands with extra time and occasional repetition ?  ?  ? ?  ?Exercises   ? ?  ?General Comments   ?  ?  ? ?Pertinent Vitals/Pain Pain Assessment ?Pain Assessment: No/denies pain  ? ? ?Home Living   ?  ?  ?  ?  ?  ?  ?  ?  ?  ?   ?  ?Prior Function    ?  ?  ?   ? ?PT Goals (current goals can now be found in the care plan section) Acute Rehab PT Goals ?Patient Stated Goal: daughter states the goal is to be discharged to rehab. patient unable to particiapte in goal setting at this time. ?PT Goal Formulation: With family ?Time For Goal Achievement: 09/25/21 ?Potential to Achieve Goals: Fair ?Progress towards PT goals: Progressing toward goals ? ?  ?Frequency ? ? ? Min 2X/week ? ? ? ?  ?PT Plan Current plan remains appropriate  ? ? ?Co-evaluation   ?  ?  ?  ?  ? ?  ?AM-PAC PT "6 Clicks" Mobility   ?Outcome Measure ? Help needed turning from your back to your side while in a flat bed without using bedrails?: A Little ?Help needed moving from lying on your back to sitting on the side of a flat bed without using bedrails?: A Lot ?Help needed moving to and from a bed to a chair (including a wheelchair)?: A Little ?Help needed standing up from a chair using your arms (e.g., wheelchair or bedside chair)?: A Little ?Help needed to walk in hospital room?: A Little ?Help needed climbing 3-5 steps with a railing? : Total ?6 Click Score: 15 ? ?  ?End of Session Equipment Utilized During Treatment: Gait belt ?Activity Tolerance: Patient tolerated treatment well ?Patient left: in bed;with call bell/phone within reach;with bed alarm set ?  ?PT Visit Diagnosis: Muscle weakness (generalized) (M62.81);Difficulty in walking, not elsewhere classified (R26.2);Other symptoms and signs involving the nervous system (R29.898) ?  ? ? ?Time: 0802-2336 ?PT Time Calculation (min) (ACUTE ONLY): 21  min ? ?Charges:  $Gait Training: 8-22 mins          ?          ? ?Minna Merritts, PT, MPT ? ? ? ?Percell Locus ?09/13/2021, 11:53 AM ? ?

## 2021-09-13 NOTE — Progress Notes (Cosign Needed)
?  ?  Durable Medical Equipment  ?(From admission, onward)  ?  ? ? ?  ? ?  Start     Ordered  ? 09/13/21 1632  For home use only DME Hospital bed  Once       ?Question Answer Comment  ?Length of Need Lifetime   ?The above medical condition requires: Patient requires the ability to reposition frequently   ?Head must be elevated greater than: 45 degrees   ?Bed type Semi-electric   ?Support Surface: Gel Overlay   ?  ? 09/13/21 1631  ? ?  ?  ? ?  ?  ?

## 2021-09-14 DIAGNOSIS — I4729 Other ventricular tachycardia: Secondary | ICD-10-CM | POA: Diagnosis not present

## 2021-09-14 DIAGNOSIS — I639 Cerebral infarction, unspecified: Secondary | ICD-10-CM | POA: Diagnosis not present

## 2021-09-14 DIAGNOSIS — R778 Other specified abnormalities of plasma proteins: Secondary | ICD-10-CM | POA: Diagnosis not present

## 2021-09-14 DIAGNOSIS — I480 Paroxysmal atrial fibrillation: Secondary | ICD-10-CM | POA: Diagnosis not present

## 2021-09-14 LAB — BASIC METABOLIC PANEL
Anion gap: 8 (ref 5–15)
BUN: 20 mg/dL (ref 8–23)
CO2: 29 mmol/L (ref 22–32)
Calcium: 8.8 mg/dL — ABNORMAL LOW (ref 8.9–10.3)
Chloride: 103 mmol/L (ref 98–111)
Creatinine, Ser: 0.83 mg/dL (ref 0.44–1.00)
GFR, Estimated: >60 mL/min (ref >60)
Glucose, Bld: 109 mg/dL — ABNORMAL HIGH (ref 70–99)
Potassium: 3.7 mmol/L (ref 3.5–5.1)
Sodium: 140 mmol/L (ref 135–145)

## 2021-09-14 LAB — MAGNESIUM: Magnesium: 2 mg/dL (ref 1.7–2.4)

## 2021-09-14 MED ORDER — POTASSIUM CHLORIDE CRYS ER 20 MEQ PO TBCR
20.0000 meq | EXTENDED_RELEASE_TABLET | Freq: Every day | ORAL | Status: DC
  Administered 2021-09-14: 20 meq via ORAL
  Filled 2021-09-14: qty 1

## 2021-09-14 MED ORDER — POTASSIUM CHLORIDE CRYS ER 20 MEQ PO TBCR
20.0000 meq | EXTENDED_RELEASE_TABLET | Freq: Every day | ORAL | 0 refills | Status: DC
Start: 2021-09-15 — End: 2024-01-03

## 2021-09-14 MED ORDER — FUROSEMIDE 40 MG PO TABS
40.0000 mg | ORAL_TABLET | Freq: Every day | ORAL | 0 refills | Status: DC
Start: 2021-09-14 — End: 2022-09-06

## 2021-09-14 MED ORDER — ATORVASTATIN CALCIUM 40 MG PO TABS
40.0000 mg | ORAL_TABLET | Freq: Every day | ORAL | 0 refills | Status: DC
Start: 2021-09-14 — End: 2024-01-03

## 2021-09-14 MED ORDER — ASPIRIN EC 81 MG PO TBEC: 81.0000 mg | DELAYED_RELEASE_TABLET | Freq: Every day | ORAL | 0 refills | Status: DC

## 2021-09-14 NOTE — Progress Notes (Signed)
AVS given and reviewed with pt and daughter, Pamela Wolfe. Medications discussed. All questions answered to satisfaction. Pt's daughter, Pamela Wolfe, verbalized understanding of information given. Pt escorted off the unit with all belongings via wheelchair by staff member.  ?

## 2021-09-14 NOTE — Progress Notes (Signed)
Physical Therapy Treatment ?Patient Details ?Name: Pamela Wolfe ?MRN: [id number] ?DOB: March 20, 1934 ?Today's Date: 09/14/2021 ? ? ?History of Present Illness presented to ER secondary to progressive weakness, difficulty with speech, AMS, recurrent falls; admitted for medical work-up of AMS. MRI of brain reports small acute to subacute right basal ganglia infarct and extensive chronic small vessel ischemic disease ? ?  ?PT Comments  ? ? Patient is agreeable to PT. Daughter at the bedside reports the plan is to now go home with home health and a hospital bed. The patient ambulated in hallway with rolling [place] with no loss of balance. Stair training completed. Patient went up and down 4 steps with rails with Min guard assistance and moderate cues for safety. She is progressing well with increased activity tolerance. She needs safety cues for activity. Recommend to continue PT to maximize independence. She could benefit from SNF, however plan is now for home. Recommend home health PT.  ?  ?Recommendations for follow up therapy are one component of a multi-disciplinary discharge planning process, led by the attending physician.  Recommendations may be updated based on patient status, additional functional criteria and insurance authorization. ? ?Follow Up Recommendations ? Skilled nursing-short term rehab (<3 hours/day) ?  ?  ?Assistance Recommended at Discharge Intermittent Supervision/Assistance  ?Patient can return home with the following A little help with walking and/or transfers;A little help with bathing/dressing/bathroom;Assist for transportation;Help with stairs or ramp for entrance ?  ?Equipment Recommendations ? None recommended by PT  ?  ?Recommendations for Other Services   ? ? ?  ?Precautions / Restrictions Precautions ?Precautions: Fall ?Restrictions ?Weight Bearing Restrictions: No  ?  ? ?Mobility ? Bed Mobility ?Overal bed mobility: Needs Assistance ?Bed Mobility: Supine to Sit, Sit to Supine ?  ?  ?Supine to  sit: Min assist ?Sit to supine: Min assist ?  ?General bed mobility comments: assistance for trunk support to sit upright and assistance for LE support to return to bed. increased time and effort required ?  ? ?Transfers ?Overall transfer level: Needs assistance ?Equipment used: Rolling [place] (2 wheels) ?Transfers: Sit to/from Stand, Bed to chair/wheelchair/BSC ?Sit to Stand: Min guard ?  ?Step pivot transfers: Min assist (without assistive device, from bed to bed side commode) ?  ?  ?  ?General transfer comment: verbal cues for hand placement with standing. multiple standing bouts performed, including from bed, bed side commode, and from transport chair. ?  ? ?Ambulation/Gait ?Ambulation/Gait assistance: Min guard ?Gait Distance (Feet): 170 Feet ?Assistive device: Rolling [place] (2 wheels) ?Gait Pattern/deviations: Decreased stride length ?Gait velocity: decreased ?  ?  ?General Gait Details: verbal cues for safety and cues for navigation with hallway ambulation. encouragement provided to use rolling [place] for safety and fall prevention with ambulation ? ? ?Stairs ?Stairs: Yes ?Stairs assistance: Min guard ?Stair Management: Two rails, Step to pattern, Forwards ?Number of Stairs: 4 ?General stair comments: patient went up and down 4 steps with rails, step to pattern with min guard for safety. moderate verbal cues for sequencing ? ? ?Wheelchair Mobility ?  ? ?Modified Rankin (Stroke Patients Only) ?  ? ? ?  ?Balance Overall balance assessment: Needs assistance ?Sitting-balance support: No upper extremity supported, Feet supported ?Sitting balance-Leahy Scale: Good ?  ?  ?Standing balance support: Bilateral upper extremity supported ?Standing balance-Leahy Scale: Fair ?Standing balance comment: using rolling [place] for support in standing ?  ?  ?  ?  ?  ?  ?  ?  ?  ?  ?  ?  ? ?  ?  Cognition Arousal/Alertness: Awake/alert ?Behavior During Therapy: [facility] for tasks assessed/performed ?Overall Cognitive Status: History of  cognitive impairments - at baseline ?  ?  ?  ?  ?  ?  ?  ?  ?  ?  ?  ?  ?  ?  ?  ?  ?General Comments: patient is following single step commands consistently with extra time. she is hard of hearing and has hearing aide in place ?  ?  ? ?  ?Exercises   ? ?  ?General Comments General comments (skin integrity, edema, etc.): patient had a large bowel movement in the bed side commode prior to ambulating. patient required maximal assistance for donning/doffing brief and for peri-hygiene ?  ?  ? ?Pertinent Vitals/Pain Pain Assessment ?Pain Assessment: No/denies pain  ? ? ?Home Living   ?  ?  ?  ?  ?  ?  ?  ?  ?  ?   ?  ?Prior Function    ?  ?  ?   ? ?PT Goals (current goals can now be found in the care plan section) Acute Rehab PT Goals ?Patient Stated Goal: none stated. family has now decided to take the patient home at discharge ?PT Goal Formulation: With family ?Time For Goal Achievement: 09/25/21 ?Potential to Achieve Goals: Fair ?Progress towards PT goals: Progressing toward goals ? ?  ?Frequency ? ? ? Min 2X/week ? ? ? ?  ?PT Plan Current plan remains appropriate  ? ? ?Co-evaluation   ?  ?  ?  ?  ? ?  ?AM-PAC PT "6 Clicks" Mobility   ?Outcome Measure ? Help needed turning from your back to your side while in a flat bed without using bedrails?: A Little ?Help needed moving from lying on your back to sitting on the side of a flat bed without using bedrails?: A Lot ?Help needed moving to and from a bed to a chair (including a wheelchair)?: A Little ?Help needed standing up from a chair using your arms (e.g., wheelchair or bedside chair)?: A Little ?Help needed to walk in hospital room?: A Little ?Help needed climbing 3-5 steps with a railing? : A Little ?6 Click Score: 17 ? ?  ?End of Session Equipment Utilized During Treatment: Gait belt ?Activity Tolerance: Patient tolerated treatment well ?Patient left: in bed;with call bell/phone within reach;with bed alarm set ?Nurse Communication: Mobility status ?PT Visit  Diagnosis: Muscle weakness (generalized) (M62.81);Difficulty in walking, not elsewhere classified (R26.2);Other symptoms and signs involving the nervous system (R29.898) ?  ? ? ?Time: 0092-3300 ?PT Time Calculation (min) (ACUTE ONLY): 45 min ? ?Charges:  $Gait Training: 23-37 mins ?$Therapeutic Activity: 8-22 mins          ?          ?Minna Merritts, PT, MPT ? ? ? ?Percell Locus ?09/14/2021, 12:46 PM ? ?

## 2021-09-14 NOTE — TOC Progression Note (Incomplete)
Transition of Care (TOC) - Progression Note  ? ? ?Patient Details  ?Name: Pamela Wolfe ?MRN: [id number] ?Date of Birth: 11/19/1933 ? ?Transition of Care (TOC) CM/SW Contact  ?Pete Pelt, RN ?Phone Number: ?09/14/2021, 1:41 PM ? ?Clinical Narrative:   Patient and family considered SNF and when presented with SNF bed offers, remained with the decision to return home.  As per family, bed was just delivered at this time.  Family states they are ready for patient to transfer home.  Meg from [facility] aware.   ? ? ? ?Expected Discharge Plan: [place] ?Barriers to Discharge: Continued Medical Work up ? ?Expected Discharge Plan and Services ?Expected Discharge Plan: [place] ?  ?Discharge Planning Services: CM Consult ?  ?Living arrangements for the past 2 months: [place] ?Expected Discharge Date: 09/14/21               ?DME Arranged: Hospital bed ?DME Agency: [employer] ?Date DME Agency Contacted: 09/13/21 ?Time DME Agency Contacted: [phone] ?  ?  ?[place] Agency: [place] ?Date HH Agency Contacted: 09/13/21 ?Time [place]: 1640 ?Representative spoke with at [place]: [place] ? ? ?Social Determinants of Health (SDOH) Interventions ?  ? ?Readmission Risk Interventions ? ?  09/13/2021  ?  4:37 PM 05/22/2021  ? 10:38 AM  ?Readmission Risk Prevention Plan  ?Post Dischage Appt  Complete  ?Medication Screening  Complete  ?Transportation Screening Complete Complete  ?PCP or Specialist Appt within 5-7 Days Complete   ?Home Care Screening Complete   ?Medication Review (RN CM) Complete   ? ? ?

## 2021-09-14 NOTE — Plan of Care (Signed)
  Problem: Pain Managment: Goal: General experience of comfort will improve Outcome: Progressing   Problem: Safety: Goal: Ability to remain free from injury will improve Outcome: Progressing   Problem: Skin Integrity: Goal: Risk for impaired skin integrity will decrease Outcome: Progressing   

## 2021-09-14 NOTE — Discharge Summary (Signed)
?Physician Discharge Summary ?  ?Patient: Pamela Wolfe MRN: [id number] DOB: 1933/12/17  ?Admit date:     09/11/2021  ?Discharge date: 09/14/21  ?Discharge Physician: Loletha Grayer  ? ?PCP: Marinda Elk, MD  ? ?Recommendations at discharge:  ? ?Follow-up PCP 5 days ?Follow-up neurology Dr. Melrose Nakayama in 2 weeks ? ?Discharge Diagnoses: ?Principal Problem: ?  Acute ischemic stroke ([place]) ?Active Problems: ?  Paroxysmal atrial fibrillation ([place]) ?  Essential hypertension ?  Memory loss ?  Depression ?  Altered mental status ?  Hypokalemia ?  History of COVID-19 ?  Elevated troponin ?  NSVT (nonsustained ventricular tachycardia) ([place]) ? ? ? ?Hospital Course: ?Ms. Pamela Wolfe is a 88 year old female with history of hypertension, CVA, GERD, memory loss.  Presented to the emergency room with difficulty speaking, falls.  By MRI she was found to have acute to subacute stroke of the right basal ganglia. ? ?Patient was seen by neurology and recommended aspirin and Plavix daily but can consider starting major anticoagulation like Eliquis in a couple weeks because she did have brief episode of paroxysmal atrial fibrillation upon coming into the hospital. ? ?LDL was 130 and patient was started on Lipitor.  CT angiogram of the head and neck showed 45% blockage of the right ICA.  Echocardiogram showed a normal EF. ? ?The patient worked with physical therapy and was able to walk 170 feet.  The patient's daughter was thinking about rehab but then decided to go home with home health.  Home health was set up. ? ? ? ? ? ?Assessment and Plan: ?* Acute ischemic stroke ([place]) ?The patient takes aspirin 3 times a week  and Plavix daily at home already.  Increased aspirin to daily dosing and continue Plavix.  Neurology mentioned that we can consider Eliquis in a couple weeks as outpatient.  Had brief episode of atrial fibrillation in the emergency room.  LDL 130 and started on Lipitor.  CT angio shows 45% blockage right ICA.  Echocardiogram  shows normal EF. ? ?Paroxysmal atrial fibrillation ([place]) ?Brief atrial fibrillation in the emergency room.  Currently in normal sinus rhythm. ? ?NSVT (nonsustained ventricular tachycardia) ([place]) ?Only 6 beats of NSVT.  With echocardiogram showing a normal ejection fraction, I think this is secondary to electrolyte abnormalities.  Replace potassium daily. ? ?Elevated troponin ?Likely secondary to stroke. ? ?Hypokalemia ?Replace daily potassium since on Lasix. ? ?Altered mental status ?Mental status improved from admission. ? ?Depression ?Continue home bupropion 150 mg p.o. twice daily, citalopram 20 mg daily ? ?Memory loss ?Continue memantine 10 mg twice daily p.o. ? ?Essential hypertension ?Continue losartan 50 mg daily, furosemide 40 mg p.o. daily ? ? ? ? ?  ? ? ?Consultants: Neurology ?Procedures performed: None ?Disposition: Home health ?Diet recommendation:  ?Cardiac diet ?DISCHARGE MEDICATION: ?Allergies as of 09/14/2021   ?No Known Allergies ?  ? ?  ?Medication List  ?  ? ?STOP taking these medications   ? ?ciprofloxacin 250 MG tablet ?Commonly known as: CIPRO ?  ?traMADol 50 MG tablet ?Commonly known as: ULTRAM ?  ?traZODone 50 MG tablet ?Commonly known as: DESYREL ?  ? ?  ? ?TAKE these medications   ? ?acetaminophen 500 MG tablet ?Commonly known as: TYLENOL ?Take 500 mg by mouth every 6 (six) hours as needed for headache, fever, mild pain or moderate pain. ?  ?albuterol 108 (90 Base) MCG/ACT inhaler ?Commonly known as: VENTOLIN HFA ?Inhale 2 puffs into the lungs every 6 (six) hours as needed for wheezing or  shortness of breath. ?  ?aspirin EC 81 MG tablet ?Take 1 tablet (81 mg total) by mouth daily. Take 1 tablet 3 times a week on Monday, Wednesday and Friday. ?What changed:  ?how much to take ?when to take this ?  ?atorvastatin 40 MG tablet ?Commonly known as: LIPITOR ?Take 1 tablet (40 mg total) by mouth at bedtime. ?  ?b complex vitamins capsule ?Take 1 capsule by mouth daily. ?  ?buPROPion 150 MG 12 hr  tablet ?Commonly known as: WELLBUTRIN SR ?Take 1 tablet by mouth 2 (two) times daily. ?  ?citalopram 20 MG tablet ?Commonly known as: CELEXA ?Take 20 mg by mouth daily. ?  ?clopidogrel 75 MG tablet ?Commonly known as: PLAVIX ?Take 1 tablet by mouth daily. ?  ?CRANBERRY PO ?Take 252 mg by mouth every evening. ?  ?furosemide 40 MG tablet ?Commonly known as: Lasix ?Take 1 tablet (40 mg total) by mouth daily. ?  ?gentamicin 0.3 % ophthalmic solution ?Commonly known as: GARAMYCIN ?Place 1 drop into both eyes every 4 (four) hours. ?  ?losartan 50 MG tablet ?Commonly known as: COZAAR ?Take 1 tablet (50 mg total) by mouth daily. ?  ?memantine 10 MG tablet ?Commonly known as: NAMENDA ?Take 1 tablet by mouth 2 (two) times daily. ?  ?Multivitamin Women Tabs ?Take 1 tablet by mouth daily. ?  ?PreserVision AREDS Caps ?Take 2 capsules by mouth every evening. ?  ?pantoprazole 20 MG tablet ?Commonly known as: PROTONIX ?Take 20 mg by mouth daily. ?  ?potassium chloride SA 20 MEQ tablet ?Commonly known as: KLOR-CON M ?Take 1 tablet (20 mEq total) by mouth daily. ?Start taking on: Sep 15, 2021 ?  ?PROBIOTIC PO ?Take 1 capsule by mouth daily. ?  ? ?  ? ?  ?  ? ? ?  ?Durable Medical Equipment  ?(From admission, onward)  ?  ? ? ?  ? ?  Start     Ordered  ? 09/13/21 1632  For home use only DME Hospital bed  Once       ?Question Answer Comment  ?Length of Need Lifetime   ?The above medical condition requires: Patient requires the ability to reposition frequently   ?Head must be elevated greater than: 45 degrees   ?Bed type Semi-electric   ?Support Surface: Gel Overlay   ?  ? 09/13/21 1631  ? ?  ?  ? ?  ? ? Follow-up Information   ? ? Marinda Elk, MD. Go on 09/20/2021.   ?Specialty: Physician Assistant ?Why: '@1'$ :15pm ?Contact information: ?[address] ?[facility] ?[place] [address] ?[phone] ? ? ?  ?  ? ? Anabel Bene, MD. Go on 09/20/2021.   ?Specialty: Neurology ?Why: '@2'$ :30pm ?Contact  information: ?[address] ?[facility] ?[place] [address] ?[phone] ? ? ?  ?  ? ?  ?  ? ?  ? ?Discharge Exam: ?Filed Weights  ? 09/11/21 0911  ?Weight: 90.7 kg  ? ?Physical Exam ?HENT:  ?   Head: Normocephalic.  ?   Mouth/Throat:  ?   Pharynx: No oropharyngeal exudate.  ?Eyes:  ?   General: Lids are normal.  ?   Conjunctiva/sclera: Conjunctivae normal.  ?Cardiovascular:  ?   Rate and Rhythm: Normal rate and regular rhythm.  ?   Heart sounds: Normal heart sounds, S1 normal and S2 normal.  ?Pulmonary:  ?   Breath sounds: Normal breath sounds. No decreased breath sounds, wheezing, rhonchi or rales.  ?Abdominal:  ?  Palpations: Abdomen is soft.  ?   Tenderness: There is no abdominal tenderness.  ?Musculoskeletal:  ?   Right lower leg: No swelling.  ?   Left lower leg: No swelling.  ?Skin: ?   General: Skin is warm.  ?   Findings: No rash.  ?Neurological:  ?   Mental Status: She is alert.  ?   Comments: Able to straight leg raise  ?  ? ?Condition at discharge: stable ? ?The results of significant diagnostics from this hospitalization (including imaging, microbiology, ancillary and laboratory) are listed below for reference.  ? ?Imaging Studies: ?CT ANGIO HEAD NECK W WO CM ? ?Result Date: 09/12/2021 ?CLINICAL DATA:  Stroke on preceding brain MRI EXAM: CT ANGIOGRAPHY HEAD AND NECK TECHNIQUE: Multidetector CT imaging of the head and neck was performed using the standard protocol during bolus administration of intravenous contrast. Multiplanar CT image reconstructions and MIPs were obtained to evaluate the vascular anatomy. Carotid stenosis measurements (when applicable) are obtained utilizing NASCET criteria, using the distal internal carotid diameter as the denominator. RADIATION DOSE REDUCTION: This exam was performed according to the departmental dose-optimization program which includes automated exposure control, adjustment of the mA and/or kV according to patient size and/or use of  iterative reconstruction technique. CONTRAST:  19m OMNIPAQUE IOHEXOL 350 MG/ML SOLN COMPARISON:  Brain MRI from yesterday FINDINGS: CT HEAD FINDINGS Brain: Recent right basal ganglia infarct by MRI. Extensive chronic sma

## 2021-09-16 LAB — CULTURE, BLOOD (ROUTINE X 2)
Culture: NO GROWTH
Culture: NO GROWTH
Special Requests: ADEQUATE

## 2021-10-08 ENCOUNTER — Other Ambulatory Visit: Payer: Self-pay

## 2021-10-08 ENCOUNTER — Emergency Department: Payer: Medicare Other

## 2021-10-08 ENCOUNTER — Emergency Department
Admission: EM | Admit: 2021-10-08 | Discharge: 2021-10-08 | Disposition: A | Discharge status: Home / Self Care | Payer: Medicare Other | Attending: Emergency Medicine | Admitting: Emergency Medicine

## 2021-10-08 DIAGNOSIS — Z043 Encounter for examination and observation following other accident: Secondary | ICD-10-CM | POA: Insufficient documentation

## 2021-10-08 DIAGNOSIS — W19XXXA Unspecified fall, initial encounter: Secondary | ICD-10-CM

## 2021-10-08 DIAGNOSIS — I10 Essential (primary) hypertension: Secondary | ICD-10-CM | POA: Insufficient documentation

## 2021-10-08 DIAGNOSIS — W1839XA Other fall on same level, initial encounter: Secondary | ICD-10-CM | POA: Diagnosis not present

## 2021-10-08 DIAGNOSIS — F0390 Unspecified dementia without behavioral disturbance: Secondary | ICD-10-CM | POA: Insufficient documentation

## 2021-10-08 LAB — CBC
HCT: 39.4 % (ref 36.0–46.0)
Hemoglobin: 13.2 g/dL (ref 12.0–15.0)
MCH: 31.4 pg (ref 26.0–34.0)
MCHC: 33.5 g/dL (ref 30.0–36.0)
MCV: 93.6 fL (ref 80.0–100.0)
Platelets: 269 10*3/uL (ref 150–400)
RBC: 4.21 MIL/uL (ref 3.87–5.11)
RDW: 14 % (ref 11.5–15.5)
WBC: 8 10*3/uL (ref 4.0–10.5)
nRBC: 0 % (ref 0.0–0.2)

## 2021-10-08 LAB — BASIC METABOLIC PANEL
Anion gap: 7 (ref 5–15)
BUN: 15 mg/dL (ref 8–23)
CO2: 31 mmol/L (ref 22–32)
Calcium: 9.1 mg/dL (ref 8.9–10.3)
Chloride: 100 mmol/L (ref 98–111)
Creatinine, Ser: 0.84 mg/dL (ref 0.44–1.00)
GFR, Estimated: >60 mL/min (ref >60)
Glucose, Bld: 121 mg/dL — ABNORMAL HIGH (ref 70–99)
Potassium: 3.4 mmol/L — ABNORMAL LOW (ref 3.5–5.1)
Sodium: 138 mmol/L (ref 135–145)

## 2021-10-08 LAB — URINALYSIS, COMPLETE (UACMP) WITH MICROSCOPIC
Bilirubin Urine: NEGATIVE
Glucose, UA: NEGATIVE mg/dL
Hgb urine dipstick: NEGATIVE
Ketones, ur: NEGATIVE mg/dL
Leukocytes,Ua: NEGATIVE
Nitrite: POSITIVE — AB
Protein, ur: NEGATIVE mg/dL
Specific Gravity, Urine: 1.01 (ref 1.005–1.030)
pH: 7 (ref 5.0–8.0)

## 2021-10-08 NOTE — ED Triage Notes (Signed)
Pt to er room number 15 via ems, ems states that pt was found down this am, states that pt lives at home with daughter.  Per ems pt has dementia, and is oriented to self.  States that pt was sent in for general weakness.

## 2021-10-08 NOTE — ED Provider Notes (Signed)
Emergency department handoff note  Care of this patient was signed out to me at the end of the previous provider shift.  All pertinent patient information was conveyed and all questions were answered.  Patient pending CT of the head and urinalysis.  CT of the head did not show any evidence of acute abnormalities however did show evidence of chronic ischemic changes.  Patient's urinalysis was nitrate positive however had few bacteria in it and therefore patient will not need treatment at this time for UTI however will need follow-up within the next week to recheck her urine. The patient has been reexamined and is ready to be discharged.  All diagnostic results have been reviewed and discussed with the patient/family.  Care plan has been outlined and the patient/family understands all current diagnoses, results, and treatment plans.  There are no new complaints, changes, or physical findings at this time.  All questions have been addressed and answered.  Patient was instructed to, and agrees to follow-up with their primary care physician as well as return to the emergency department if any new or worsening symptoms develop.   Naaman Plummer, MD 10/08/21 [phone]

## 2021-10-08 NOTE — ED Provider Notes (Signed)
   [facility] Provider Note    Event Date/Time   First MD Initiated Contact with Patient 10/08/21 1410     (approximate)  History   Chief Complaint: Fall  HPI  Pamela Wolfe is a 88 y.o. female with a past medical history hypertension, gastric reflux, presents to the emergency department after a fall.  Per EMS report patient lives at home with a daughter patient has dementia, daughter found the patient down on the ground, unwitnessed fall.  Patient has no complaints.  Denies any pain.  Is unable to give any history does not recall why she is here does not know exactly where she is but she is able to tell us her name.  Awaiting family arrival for further history.  Physical Exam   General: Awake, no distress.  No signs of head trauma. CV:  Good peripheral perfusion.  Regular rate and rhythm  Resp:  Normal effort.  Equal breath sounds bilaterally.  Abd:  No distention.  Soft, nontender.  No rebound or guarding. Other:  Good range of motion in all extremities with no pain elicited.   ED Results / Procedures / Treatments   EKG  EKG viewed and interpreted by myself shows normal sinus rhythm at 75 bpm with a slightly widened QRS, normal axis, nonspecific ST changes  RADIOLOGY  CT head pending   MEDICATIONS ORDERED IN ED: Medications - No data to display   IMPRESSION / MDM / [place] / ED COURSE  I reviewed the triage vital signs and the nursing notes.  Patient's presentation is most consistent with acute presentation with potential threat to life or bodily function.  Patient presents emergency department after an unwitnessed fall at home.  Patient has dementia and cannot contribute to her past medical history or recent events.  We will check labs, urine and a head CT as a precaution.  Awaiting family arrival for further history.  Lab work shows a normal CBC, reassuring chemistry.  CT scan of the head and urine is pending.  Patient care signed  out to oncoming provider.  FINAL CLINICAL IMPRESSION(S) / ED DIAGNOSES   Fall  Note:  This document was prepared using Dragon voice recognition software and may include unintentional dictation errors.   Harvest Dark, MD 10/08/21 [phone]

## 2021-10-08 NOTE — ED Notes (Signed)
In and out cath complete for urine sample successful on second attempt, female tech at bedside to maintain sterile technique, pt tolerated well.

## 2021-10-08 NOTE — ED Notes (Signed)
Pt in bed, family at bedside, daughter verbalized understanding d/c instructions and follow up, pt from dpt with wc and daughter.

## 2021-12-07 ENCOUNTER — Ambulatory Visit: Payer: Medicare Other

## 2021-12-14 ENCOUNTER — Ambulatory Visit: Payer: Self-pay | Admitting: Urology

## 2021-12-14 NOTE — Progress Notes (Incomplete)
12/14/21 8:34 AM   Pamela Wolfe 01/31/1934 [id number]  Referring provider:  Marinda Elk, [place] [facility]  [place] [address] No chief complaint on file.      HPI: Pamela Wolfe is a 88 y.o.female with a personal history of left renal lesion, urinary incontinence and rUTIs, who returns for 1 year follow-up and RUS.    She was previously seen by a urologist in [place] who offered an implantable device for management of her urinary incontinence around 2017.  She declined this.   RUS on 12/03/2020 which revealed cystic lesion within the left kidney which corresponds to that seen on prior CT and ultrasound. It appeared simple in nature on exam measuring 1.6 cm.        PMH: Past Medical History:  Diagnosis Date   Depression    Essential hypertension 01/26/2017   GERD (gastroesophageal reflux disease)    History of stroke 12/08/2016   Loss of memory 12/08/2016   Macular degeneration    Neck pain 12/08/2016   Recurrent major depressive disorder, in full remission ([place]) 01/26/2017   Sleep apnea    Tremor 02/06/2017    Surgical History: Past Surgical History:  Procedure Laterality Date   ABDOMINAL HYSTERECTOMY  1975   BUNIONECTOMY  2016   CATARACT EXTRACTION, BILATERAL     CHOLECYSTECTOMY     SALIVARY GLAND SURGERY      Home Medications:  Allergies as of 12/14/2021   No Known Allergies      Medication List        Accurate as of December 14, 2021  8:34 AM. If you have any questions, ask your nurse or doctor.          acetaminophen 500 MG tablet Commonly known as: TYLENOL Take 500 mg by mouth every 6 (six) hours as needed for headache, fever, mild pain or moderate pain.   albuterol 108 (90 Base) MCG/ACT inhaler Commonly known as: VENTOLIN HFA Inhale 2 puffs into the lungs every 6 (six) hours as needed for wheezing or shortness of breath.   aspirin EC 81 MG tablet Take 1 tablet (81 mg total) by mouth daily. Take 1 tablet  3 times a week on Monday, Wednesday and Friday.   atorvastatin 40 MG tablet Commonly known as: LIPITOR Take 1 tablet (40 mg total) by mouth at bedtime.   b complex vitamins capsule Take 1 capsule by mouth daily.   buPROPion 150 MG 12 hr tablet Commonly known as: WELLBUTRIN SR Take 1 tablet by mouth 2 (two) times daily.   citalopram 20 MG tablet Commonly known as: CELEXA Take 20 mg by mouth daily.   clopidogrel 75 MG tablet Commonly known as: PLAVIX Take 1 tablet by mouth daily.   CRANBERRY PO Take 252 mg by mouth every evening.   furosemide 40 MG tablet Commonly known as: Lasix Take 1 tablet (40 mg total) by mouth daily.   gentamicin 0.3 % ophthalmic solution Commonly known as: GARAMYCIN Place 1 drop into both eyes every 4 (four) hours.   losartan 50 MG tablet Commonly known as: COZAAR Take 1 tablet (50 mg total) by mouth daily.   memantine 10 MG tablet Commonly known as: NAMENDA Take 1 tablet by mouth 2 (two) times daily.   Multivitamin Women Tabs Take 1 tablet by mouth daily.   PreserVision AREDS Caps Take 2 capsules by mouth every evening.   pantoprazole 20 MG tablet Commonly known as: PROTONIX Take 20 mg by mouth  daily.   potassium chloride SA 20 MEQ tablet Commonly known as: KLOR-CON M Take 1 tablet (20 mEq total) by mouth daily.   PROBIOTIC PO Take 1 capsule by mouth daily.        Allergies:  No Known Allergies  Family History: Family History  Problem Relation Age of Onset   Hypertension Mother    Hypertension Father    Depression Father    Bladder Cancer Neg Hx    Kidney cancer Neg Hx    Prostate cancer Neg Hx     Social History:  reports that she has quit smoking. She has never used smokeless tobacco. She reports current alcohol use. She reports that she does not use drugs.   Physical Exam: There were no vitals taken for this visit.  Constitutional:  Alert and oriented, No acute distress. HEENT: [place] AT, moist mucus membranes.   Trachea midline, no masses. Cardiovascular: No clubbing, cyanosis, or edema. Respiratory: Normal respiratory effort, no increased work of breathing. Skin: No rashes, bruises or suspicious lesions. Neurologic: Grossly intact, no focal deficits, moving all 4 extremities. Psychiatric: Normal mood and affect.  Laboratory Data:  Lab Results  Component Value Date   CREATININE 0.84 10/08/2021   Lab Results  Component Value Date   HGBA1C 5.3 09/11/2021    Urinalysis   Pertinent Imaging:    Assessment & Plan:     No follow-ups on file.  I,Kailey Littlejohn,acting as a Education administrator for [name], MD.,have documented all relevant documentation on the behalf of [name], MD,as directed by  [name], MD while in the presence of [name], [place] [address], [place], [place] [address] [phone]

## 2022-06-28 ENCOUNTER — Ambulatory Visit: Payer: Medicare Other | Admitting: Cardiovascular Disease

## 2022-09-06 ENCOUNTER — Other Ambulatory Visit: Payer: Self-pay | Admitting: Cardiovascular Disease

## 2023-01-08 IMAGING — CT CT HEAD W/O CM
4 series · 16 of 47 positions shown, 18 images · non-contrast
Comparison: Head and neck CTA 09/12/2021.  Head MRI 09/11/2021.

CLINICAL DATA: Head trauma. Found down this morning. History of
dementia.



[Series 2: head wo · axial · 0.44mm/px · z∈[+413,+528]mm · 7 of 31 slices shown, 9 images]
[im 4/31  brain]
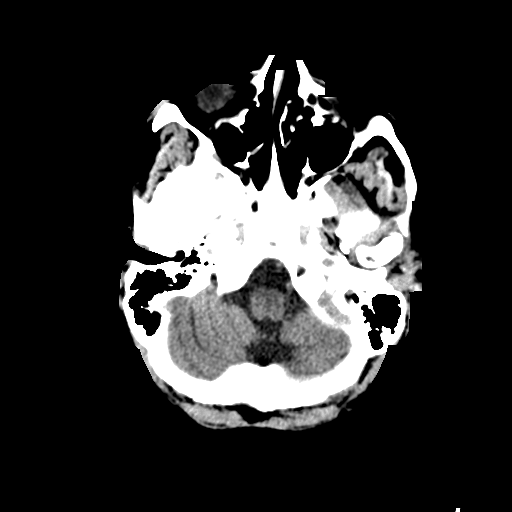
[im 4/31  bone]
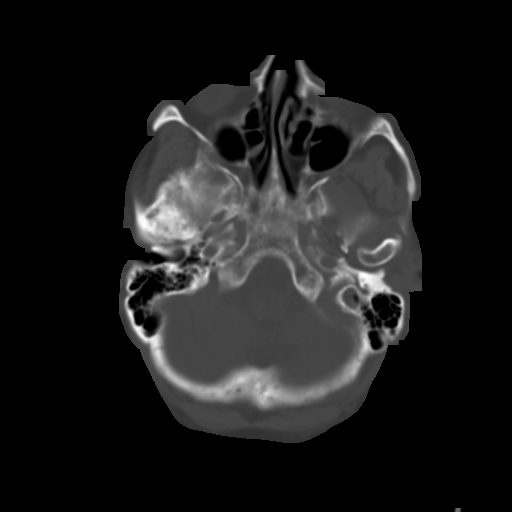
[im 8/31  brain]
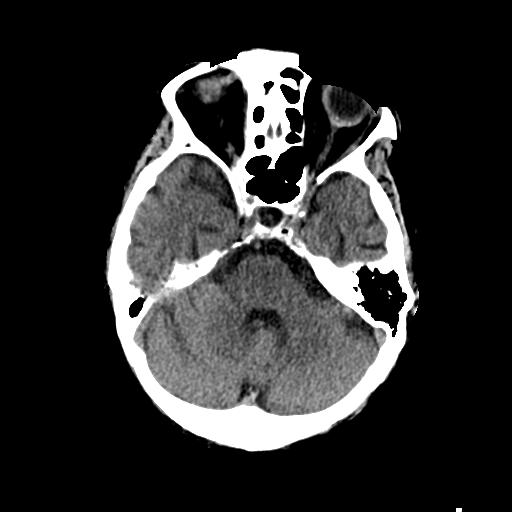
[im 12/31  brain]
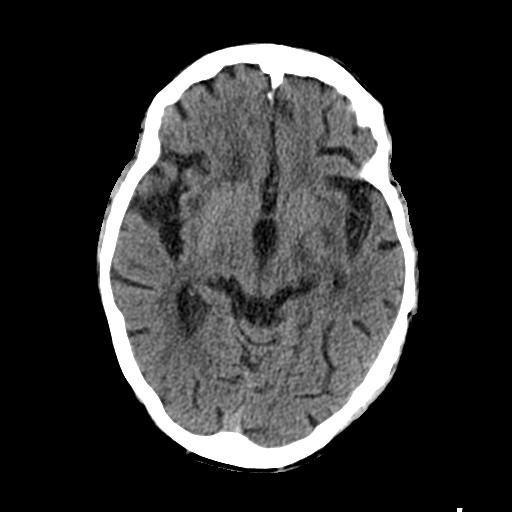
[im 16/31  brain]
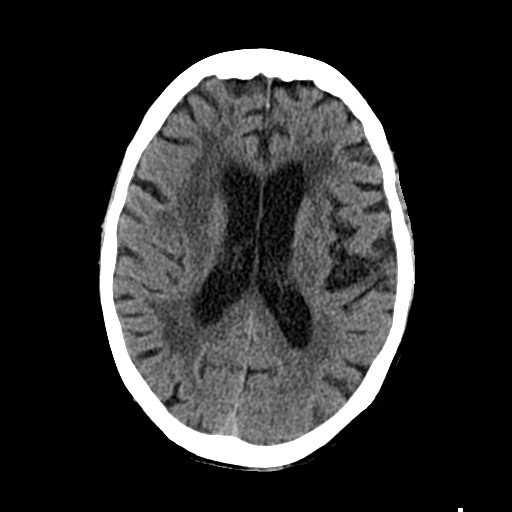
[im 19/31  brain]
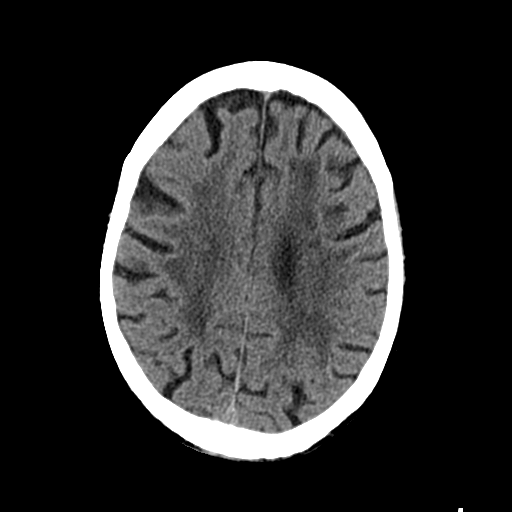
[im 19/31  bone]
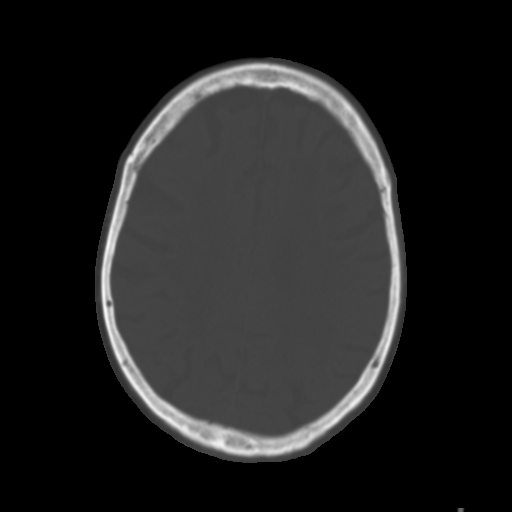
[im 23/31  brain]
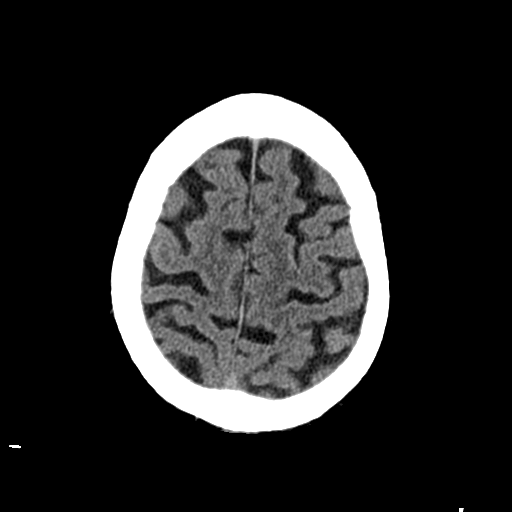
[im 27/31  brain]
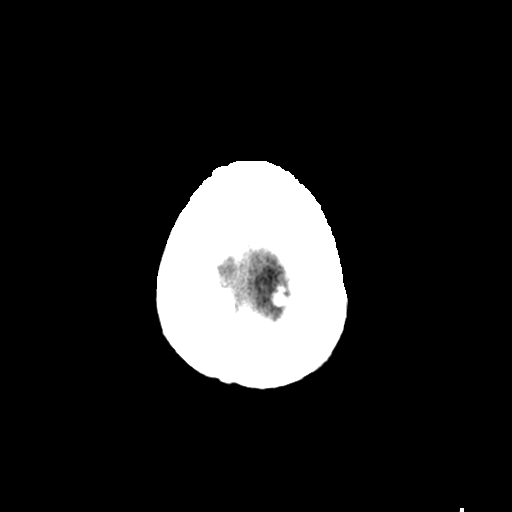

[Series 3: head bone · axial · 0.44mm/px · z∈[+412,+442]mm · 3 of 76 slices shown]
[im 8/76  bone]
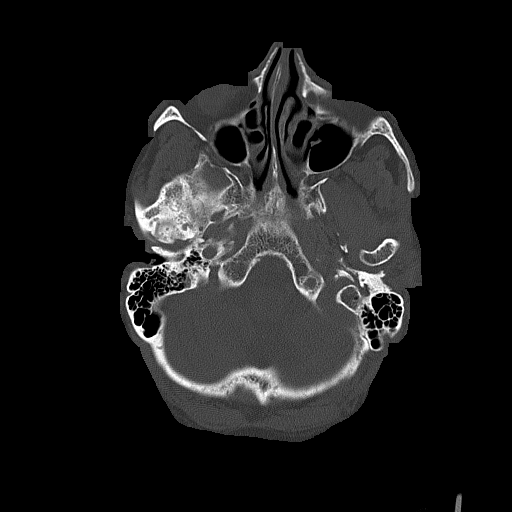
[im 16/76  bone]
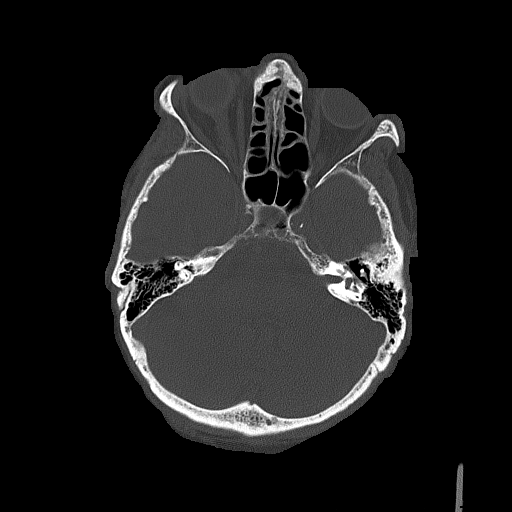
[im 23/76  bone]
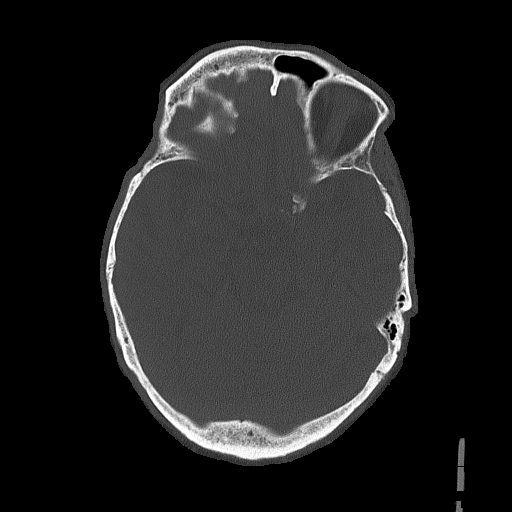

[Series 4: coronal soft tissue · coronal · 0.31mm/px · 3 of 65 slices shown]
[im 22/65  brain]
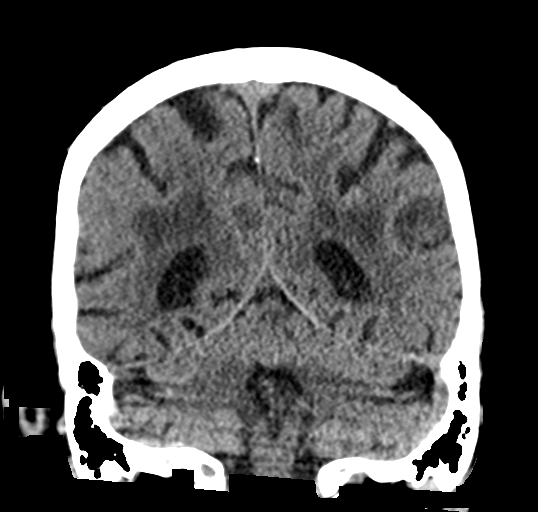
[im 29/65  brain]
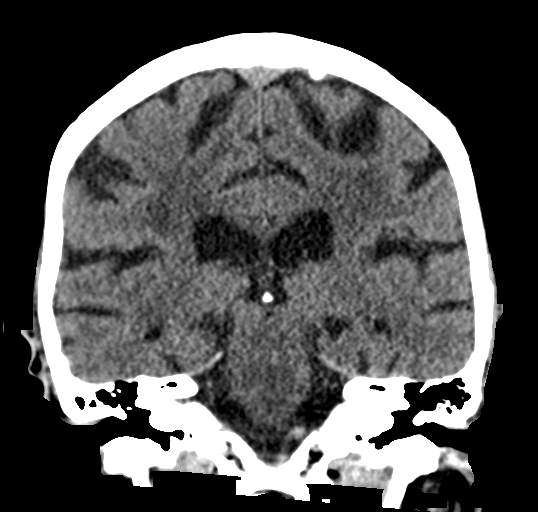
[im 36/65  brain]
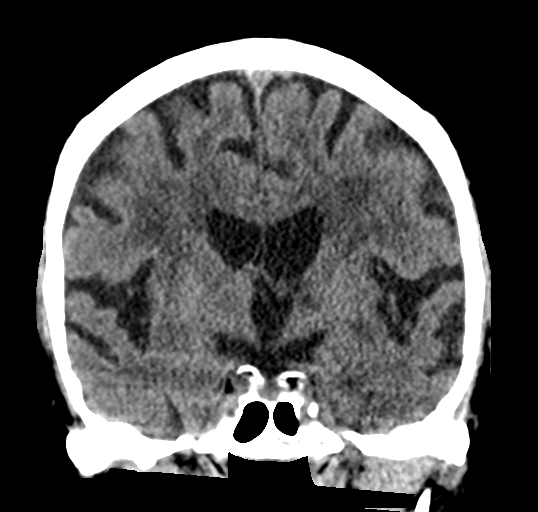

[Series 5: sagittal soft tissue · sagittal · 0.33mm/px · 3 of 53 slices shown]
[im 18/53  brain]
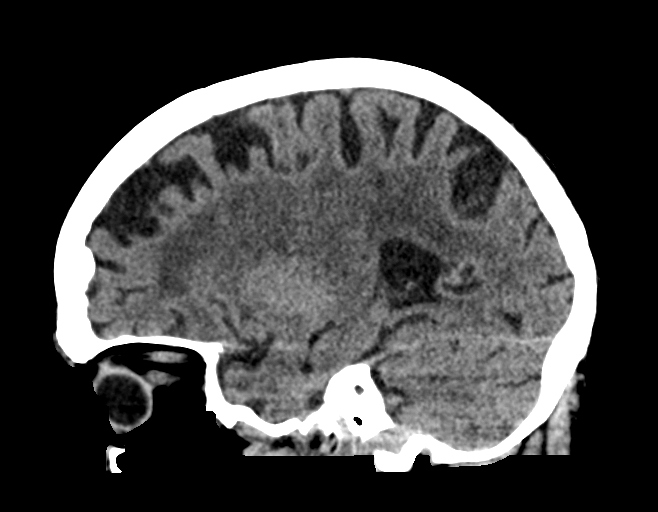
[im 27/53  brain]
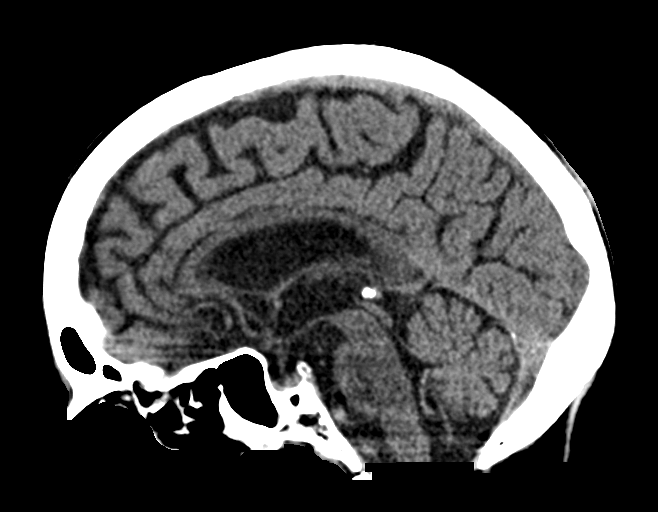
[im 35/53  brain]
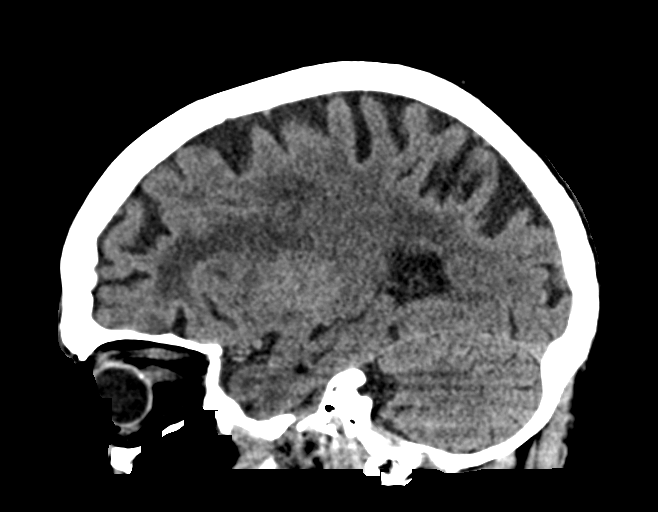

[16 of 47 positions shown; findings below may reference images not displayed]

FINDINGS: Brain: There is no evidence of an acute infarct, intracranial
hemorrhage, mass, midline shift, or extra-axial fluid collection.
Confluent hypodensities in the cerebral white matter bilaterally are
similar to the prior CT and are nonspecific but compatible with
extensive chronic small vessel ischemic disease. An infarct at the
anterior aspect of the right basal ganglia demonstrates expected
interval evolution with developing encephalomalacia since the prior
studies. A chronic lacunar infarct is again noted near the ventral
left thalamus/genu of the left internal capsule. Generalized
cerebral atrophy is mild for age.

Vascular: Calcified atherosclerosis at the skull base. No hyperdense
vessel.

Skull: No fracture or suspicious osseous lesion.

Sinuses/Orbits: Small volume fluid in the sphenoid sinuses. Clear
mastoid air cells. Bilateral cataract extraction.

Other: None.
IMPRESSION: 1. No evidence of acute intracranial abnormality.
2. Extensive chronic small vessel ischemic disease.

## 2023-12-01 DEATH — deceased
